# Patient Record
Sex: Female | Born: 1990
Health system: Southern US, Community
[De-identification: ages and names within clinical notes are randomized; demographics above are authoritative.]

## PROBLEM LIST (undated history)

## (undated) ENCOUNTER — Inpatient Hospital Stay (HOSPITAL_COMMUNITY): Payer: Self-pay

## (undated) DIAGNOSIS — F329 Major depressive disorder, single episode, unspecified: Secondary | ICD-10-CM

## (undated) DIAGNOSIS — F32A Depression, unspecified: Secondary | ICD-10-CM

## (undated) DIAGNOSIS — F419 Anxiety disorder, unspecified: Secondary | ICD-10-CM

## (undated) DIAGNOSIS — Z789 Other specified health status: Secondary | ICD-10-CM

## (undated) HISTORY — PX: TONSILLECTOMY: SUR1361

## (undated) HISTORY — PX: WISDOM TOOTH EXTRACTION: SHX21

---
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
## 2011-01-01 ENCOUNTER — Emergency Department (HOSPITAL_COMMUNITY)
Admission: EM | Admit: 2011-01-01 | Discharge: 2011-01-01 | Discharge status: Against Medical Advice | Payer: Medicaid Other | Attending: Emergency Medicine | Admitting: Emergency Medicine

## 2011-01-01 DIAGNOSIS — W868XXA Exposure to other electric current, initial encounter: Secondary | ICD-10-CM | POA: Insufficient documentation

## 2011-01-01 DIAGNOSIS — T754XXA Electrocution, initial encounter: Secondary | ICD-10-CM | POA: Insufficient documentation

## 2011-01-02 LAB — CBC
HCT: 36.3 % (ref 36.0–46.0)
Hemoglobin: 11.6 g/dL — ABNORMAL LOW (ref 12.0–15.0)
Platelets: 214 10*3/uL (ref 150–400)
RBC: 3.76 MIL/uL — ABNORMAL LOW (ref 3.87–5.11)

## 2011-01-02 LAB — GC/CHLAMYDIA PROBE AMP, GENITAL
Chlamydia, DNA Probe: NEGATIVE
GC Probe Amp, Genital: NEGATIVE

## 2011-01-02 LAB — URINALYSIS, ROUTINE W REFLEX MICROSCOPIC
Glucose, UA: NEGATIVE mg/dL
pH: 5 (ref 5.0–8.0)

## 2011-01-02 LAB — ABO/RH: ABO/RH(D): O POS

## 2011-01-02 LAB — WET PREP, GENITAL: Trich, Wet Prep: NONE SEEN

## 2011-01-04 LAB — POCT PREGNANCY, URINE: Preg Test, Ur: NEGATIVE

## 2011-01-04 LAB — URINALYSIS, ROUTINE W REFLEX MICROSCOPIC
Bilirubin Urine: NEGATIVE
Glucose, UA: NEGATIVE mg/dL
Hgb urine dipstick: NEGATIVE
Ketones, ur: NEGATIVE mg/dL
Protein, ur: NEGATIVE mg/dL
Specific Gravity, Urine: >1.03 — ABNORMAL HIGH (ref 1.005–1.030)
pH: 6 (ref 5.0–8.0)

## 2011-01-05 LAB — WET PREP, GENITAL: WBC, Wet Prep HPF POC: NONE SEEN

## 2011-01-05 LAB — URINALYSIS, ROUTINE W REFLEX MICROSCOPIC
Bilirubin Urine: NEGATIVE
Glucose, UA: NEGATIVE mg/dL
Specific Gravity, Urine: 1.019 (ref 1.005–1.030)

## 2011-01-05 LAB — POCT PREGNANCY, URINE: Preg Test, Ur: NEGATIVE

## 2011-01-05 LAB — URINE MICROSCOPIC-ADD ON

## 2011-01-05 LAB — GC/CHLAMYDIA PROBE AMP, GENITAL: GC Probe Amp, Genital: NEGATIVE

## 2011-01-06 LAB — POCT URINALYSIS DIP (DEVICE)
Glucose, UA: NEGATIVE mg/dL
Hgb urine dipstick: NEGATIVE
Specific Gravity, Urine: >=1.03 (ref 1.005–1.030)
Urobilinogen, UA: 0.2 mg/dL (ref 0.0–1.0)

## 2011-01-06 LAB — POCT PREGNANCY, URINE: Preg Test, Ur: NEGATIVE

## 2011-02-03 ENCOUNTER — Inpatient Hospital Stay (HOSPITAL_COMMUNITY)
Admission: AD | Admit: 2011-02-03 | Discharge: 2011-02-03 | Disposition: A | Discharge status: Home / Self Care | Payer: Medicaid Other | Source: Ambulatory Visit | Attending: Obstetrics and Gynecology | Admitting: Obstetrics and Gynecology

## 2011-02-03 DIAGNOSIS — O4700 False labor before 37 completed weeks of gestation, unspecified trimester: Secondary | ICD-10-CM

## 2011-02-23 ENCOUNTER — Inpatient Hospital Stay (HOSPITAL_COMMUNITY)
Admission: AD | Admit: 2011-02-23 | Discharge: 2011-02-23 | Disposition: A | Discharge status: Home / Self Care | Payer: Medicaid Other | Source: Ambulatory Visit | Attending: Obstetrics and Gynecology | Admitting: Obstetrics and Gynecology

## 2011-02-23 DIAGNOSIS — O479 False labor, unspecified: Secondary | ICD-10-CM | POA: Insufficient documentation

## 2011-03-01 ENCOUNTER — Inpatient Hospital Stay (HOSPITAL_COMMUNITY)
Admission: AD | Admit: 2011-03-01 | Discharge: 2011-03-01 | Disposition: A | Discharge status: Home / Self Care | Payer: Medicaid Other | Source: Ambulatory Visit | Attending: Obstetrics and Gynecology | Admitting: Obstetrics and Gynecology

## 2011-03-01 DIAGNOSIS — O4690 Antepartum hemorrhage, unspecified, unspecified trimester: Secondary | ICD-10-CM | POA: Insufficient documentation

## 2011-03-01 DIAGNOSIS — O368190 Decreased fetal movements, unspecified trimester, not applicable or unspecified: Secondary | ICD-10-CM | POA: Insufficient documentation

## 2011-03-01 DIAGNOSIS — O479 False labor, unspecified: Secondary | ICD-10-CM | POA: Insufficient documentation

## 2011-03-03 ENCOUNTER — Inpatient Hospital Stay (HOSPITAL_COMMUNITY)
Admission: AD | Admit: 2011-03-03 | Discharge: 2011-03-05 | DRG: 775 | Disposition: A | Discharge status: Home / Self Care | Payer: Medicaid Other | Source: Ambulatory Visit | Attending: Obstetrics and Gynecology | Admitting: Obstetrics and Gynecology

## 2011-03-03 DIAGNOSIS — Z22330 Carrier of Group B streptococcus: Secondary | ICD-10-CM

## 2011-03-03 DIAGNOSIS — O99892 Other specified diseases and conditions complicating childbirth: Principal | ICD-10-CM | POA: Diagnosis present

## 2011-03-03 LAB — CBC
HCT: 37.6 % (ref 36.0–46.0)
Hemoglobin: 12.8 g/dL (ref 12.0–15.0)
MCV: 96.7 fL (ref 78.0–100.0)
RBC: 3.89 MIL/uL (ref 3.87–5.11)
RDW: 13.3 % (ref 11.5–15.5)
WBC: 12 10*3/uL — ABNORMAL HIGH (ref 4.0–10.5)

## 2011-03-04 ENCOUNTER — Other Ambulatory Visit: Payer: Self-pay | Admitting: Obstetrics and Gynecology

## 2011-03-04 LAB — CBC
HCT: 34.3 % — ABNORMAL LOW (ref 36.0–46.0)
Hemoglobin: 11.4 g/dL — ABNORMAL LOW (ref 12.0–15.0)
MCH: 32 pg (ref 26.0–34.0)
MCV: 96.3 fL (ref 78.0–100.0)
Platelets: 157 10*3/uL (ref 150–400)
RBC: 3.56 MIL/uL — ABNORMAL LOW (ref 3.87–5.11)
WBC: 15.6 10*3/uL — ABNORMAL HIGH (ref 4.0–10.5)

## 2011-03-06 NOTE — Discharge Summary (Signed)
NAMEREBECA, VALDIVIA            ACCOUNT NO.:  [id number]  MEDICAL RECORD NO.:  [id number]           PATIENT TYPE:  I  LOCATION:  9125                          FACILITY:  [facility]  PHYSICIAN:  Malachi Pro. Ambrose Mantle, M.D. DATE OF BIRTH:  [birth year]  DATE OF ADMISSION:  03/03/2011 DATE OF DISCHARGE:  03/05/2011                              DISCHARGE SUMMARY   An 20 year old black female para 0, gravida 1, EDC Mar 07, 2011, by ultrasound, admitted in early labor.  Blood group and type O+, negative antibody, rubella immune, RPR nonreactive.  Urine culture negative, hepatitis B surface antigen negative, HIV negative, GC and Chlamydia negative, hemoglobin AA, cystic fibrosis declined.  First trimester screen negative, AFP negative, 1-hour Glucola 87, Group B strep positive.  Vaginal ultrasound on August 07, 2010, estimated gestational age [redacted] weeks 5 days, [facility] Mar 07, 2011.  Repeat ultrasound on October 03, 2010, estimated gestational age [redacted] weeks 4 days, [facility] Mar 09, 2011. Prenatal care was uncomplicated.  Nonstress test on Feb 28, 2011, for decreased fetal movement was normal.  The patient began contracting on Mar 02, 2011 and the contractions became regular on the day of admission.  Cervix had been closed on Feb 28, 2011 and on day of admission, was a fingertip 60%, vertex at -2.  PAST MEDICAL HISTORY:  No known allergies.  OPERATIONS:  T&A.  ILLNESSES:  None.  FAMILY HISTORY:  Maternal grandmother with diabetes mellitus and high blood pressure.  SOCIAL HISTORY:  Alcohol, tobacco and drugs none.  PHYSICAL EXAM ON ADMISSION:  VITAL SIGNS:  Normal. HEART/LUNGS:  Normal. ABDOMEN:  Soft.  Fundal height 35 cm.  Fetal heart tones normal.  Cervix fingertip 60%, vertex at -2.  Artificial rupture of membranes produced clear fluid.  The patient was begun on penicillin for her positive group B strep. After admission, an artificial rupture of membranes.  She did not develop a consistent labor  pattern, so Pitocin was begun.  She received an epidural and progressed a full dilatation.  After she began pushing, there was some bradycardia that sometimes persisted in spite of O2 and position change.  A left mediolateral episiotomy was done under local block and with 1 contraction, a living female infant 6 pounds 0 ounces, Apgars of 8 and 9 at 1 and 5 minutes was delivered LOA by Dr. Ambrose Mantle. The cord was short and small in caliber.  I did a cord blood collection, but it was probably inadequate.  Uterus was normal after the placenta delivered intact.  Left mediolateral episiotomy repaired with 3-0 Vicryl.  Blood loss about 400 mL.  Postpartum, the patient did quite well and was discharged on the second postpartum day.  Initial hemoglobin 12.8, hematocrit 37.6, white count 12,000, platelet count 183,000.  Followup hemoglobin 11.4, hematocrit 34.3, RPR was nonreactive.  FINAL DIAGNOSES:  Intrauterine pregnancy at 39 weeks and 3 days, delivered left occiput anterior.  OPERATION:  Spontaneous delivery, LOA, left mediolateral episiotomy and repair.  FINAL CONDITION:  Improved.  INSTRUCTIONS:  Our regular discharge instruction booklet.  Percocet 5/325, 30 tablets one every 4-6 hours needed for pain and Motrin 600 mg, 30  tablets one every 6 hours as needed for pain, given at discharge. She is advised to return in 6 weeks for followup examination.     Malachi Pro. Ambrose Mantle, M.D.     TFH/MEDQ  D:  03/05/2011  T:  03/05/2011  Job:  [id number]  Electronically Signed by Tracey Harries M.D. on 03/06/2011 11:32:11 AM

## 2011-03-07 NOTE — Op Note (Signed)
[facility]  Patient:    Tiffany Woodard, Tiffany Woodard                     MRN: [id number] Proc. Date: 04/26/01 Attending:  Carolan Shiver, M.D.                           Operative Report  PREOPERATIVE DIAGNOSIS:  Adenoid tonsillar hypertrophy with upper airway obstruction.  POSTOPERATIVE DIAGNOSIS:  Adenoid tonsillar hypertrophy with upper airway obstruction.  OPERATION PERFORMED:  Tonsillectomy and adenoidectomy.  SURGEON:  Carolan Shiver, M.D.  ANESTHESIA:  General endotracheal.  ANESTHESIOLOGIST:  Cliffton Asters. Ivin Booty, M.D.  INDICATIONS FOR PROCEDURE:  The patient is a 20-year-old black female whom I have been following for chronic ear disease beginning at age 79 months.  [place] was first seen by me on October 10, 1993.  She had a history of chronic ear disease, underwent BMTs on October 28, 1993 successfully.  She has been followed through the years.  Recently, she developed chronic upper airway obstruction with mouth breathing and snoring.  She was just short of frank obstructive sleep apnea per her mothers reports.  She had not had recurrent streptococcal tonsillitis.  PHYSICAL EXAMINATION:  On physical examination on April 01, 2001, [facility] was found to have 3-3/4+ tonsils with 0.5 cm oropharyngeal airway and near complete obstruction of her nasopharynx secondary to adenoid hyperplasia.  She was recommended for tonsillectomy and adenoidectomy.  Risks and complications of the procedures were explained to her mother.  Questions were invited and answered and informed consent was signed.  JUSTIFICATION FOR OUTPATIENT SETTING:  Patients age and need for general endotracheal anesthesia.  JUSTIFICATION FOR OVERNIGHT STAY:  23 hours of observation to rule out postoperative tonsillectomy hemorrhage, IV hydration and pain control.  DESCRIPTION OF PROCEDURE:  After the patient was taken to the operating room, she was placed in the supine position and was masked to  sleep by general anesthesia without difficulty under the guidance of Dr. Sheldon Silvan.  An IV was begun and she was orally intubated.  Eyelids were taped shut and she was properly positioned and monitored.  Elbows and ankles were padded with foam rubber.  Preoperative hemoglobin was 12.2, hematocrit was 36.2, white blood cell count was 7,800.  Platelet count 259,000.  PT 13.1, PTT 32, INR 1.0.  The patient was then turned 90 degrees and placed in the Rose position.  The head drape was applied and the Crowe-Davis mouth gag was inserted followed by a moistened throat pack.  Examination of her oropharynx revealed 4+ kissing tonsils.  The right tonsil was secured with a curved Allis clamp and an anterior pillar incision was made with cutting cautery.  The tonsillar capsule was identified and the tonsil was dissected from the tonsillar fossa with cutting and coagulating currents.  Vessels were cauterized in order.  The left tonsil was removed in the identical fashion.  Each fossa was then infiltrated with 1.5 cc of 0.5% Marcaine with 1:200,000 epinephrine.  A red rubber catheter was placed through the right naris and used as a soft palate retractor.  Examination of her nasopharynx in the mirror revealed 100% posterior choanal obstruction secondary to adenoid hyperplasia.  The adenoids were then removed with curved adenoid curets and bleeding controlled with packing and suction cautery.  The throat pack was removed and a #10 gauge [place] sump NG tube was inserted into the stomach and gastric contents were  evacuated.  The patient was then awakened, extubated and transferred to her hospital bed.  She appeared to tolerate both the general anesthesia and procedures well.  She left the operating room in stable condition.  TOTAL FLUIDS:  300 cc.  ESTIMATED BLOOD LOSS:  Less than 10 cc.  Sponge, needle and cotton ball counts were correct at termination of the procedure.  Tonsils and adenoid  specimens were sent to pathology for documentation.  The patient received Ancef 500 mg IV, Zofran 2 mg IV at the beginning and end of the procedure and Decadron 6 mg IV.  The patient will be admitted to the 23-hour recovery care stay unit for overnight observation, hydration and pain control.  If stable overnight, she will be discharged on April 27, 2001 with her mother who will be instructed to return her to my office on July 22 at 2:50 p.m.  DISCHARGE MEDICATIONS:  Augmentin suspension 600 mg p.o. b.i.d. x 10 days with food, Tylenol with codeine with elixir 1 to 1-1/2 teaspoonfuls p.o. q.4h. p.r.n. pain, Phenergan suppositories, 12.5 mg #2 1 p.r. q.6h. p.r.n. nausea. Her mother will be instructed to have her follow a soft diet x 1 week, keep her head elevated and avoid aspirin or aspirin products. They are to call [phone] for any postoperative problems and she will be given verbal and written instructions. DD:  04/26/01 TD:  04/26/01 Job: [id number]

## 2011-03-10 ENCOUNTER — Inpatient Hospital Stay (HOSPITAL_COMMUNITY)
Admission: AD | Admit: 2011-03-10 | Discharge: 2011-03-10 | Disposition: A | Discharge status: Home / Self Care | Payer: Medicaid Other | Source: Ambulatory Visit | Attending: Obstetrics and Gynecology | Admitting: Obstetrics and Gynecology

## 2011-03-10 DIAGNOSIS — O135 Gestational [pregnancy-induced] hypertension without significant proteinuria, complicating the puerperium: Secondary | ICD-10-CM | POA: Insufficient documentation

## 2011-03-10 LAB — COMPREHENSIVE METABOLIC PANEL
ALT: 20 U/L (ref 0–35)
AST: 21 U/L (ref 0–37)
CO2: 22 mEq/L (ref 19–32)
Chloride: 102 mEq/L (ref 96–112)
Creatinine, Ser: 0.78 mg/dL (ref 0.4–1.2)
GFR calc Af Amer: >60 mL/min (ref >60)
GFR calc non Af Amer: >60 mL/min (ref >60)
Glucose, Bld: 82 mg/dL (ref 70–99)
Total Bilirubin: 0.2 mg/dL — ABNORMAL LOW (ref 0.3–1.2)

## 2011-03-10 LAB — CBC
HCT: 38 % (ref 36.0–46.0)
Hemoglobin: 12.8 g/dL (ref 12.0–15.0)
MCH: 32.5 pg (ref 26.0–34.0)
MCHC: 33.7 g/dL (ref 30.0–36.0)
RBC: 3.94 MIL/uL (ref 3.87–5.11)

## 2011-03-10 LAB — URINALYSIS, ROUTINE W REFLEX MICROSCOPIC
Bilirubin Urine: NEGATIVE
Glucose, UA: NEGATIVE mg/dL
Hgb urine dipstick: NEGATIVE
Ketones, ur: NEGATIVE mg/dL
Nitrite: NEGATIVE
Specific Gravity, Urine: <1.005 — ABNORMAL LOW (ref 1.005–1.030)
pH: 6.5 (ref 5.0–8.0)

## 2011-03-10 LAB — LACTATE DEHYDROGENASE: LDH: 248 U/L (ref 94–250)

## 2011-03-11 ENCOUNTER — Emergency Department (HOSPITAL_COMMUNITY)
Admission: EM | Admit: 2011-03-11 | Discharge: 2011-03-11 | Discharge status: Against Medical Advice | Payer: Medicaid Other | Attending: Emergency Medicine | Admitting: Emergency Medicine

## 2011-03-11 DIAGNOSIS — O99893 Other specified diseases and conditions complicating puerperium: Secondary | ICD-10-CM | POA: Insufficient documentation

## 2011-03-11 DIAGNOSIS — R030 Elevated blood-pressure reading, without diagnosis of hypertension: Secondary | ICD-10-CM | POA: Insufficient documentation

## 2012-01-11 ENCOUNTER — Inpatient Hospital Stay (HOSPITAL_COMMUNITY): Payer: Medicaid Other

## 2012-01-11 ENCOUNTER — Inpatient Hospital Stay (HOSPITAL_COMMUNITY)
Admission: AD | Admit: 2012-01-11 | Discharge: 2012-01-11 | Disposition: A | Discharge status: Home / Self Care | Payer: Medicaid Other | Source: Ambulatory Visit | Attending: Obstetrics and Gynecology | Admitting: Obstetrics and Gynecology

## 2012-01-11 ENCOUNTER — Encounter (HOSPITAL_COMMUNITY): Payer: Self-pay | Admitting: *Deleted

## 2012-01-11 DIAGNOSIS — N949 Unspecified condition associated with female genital organs and menstrual cycle: Secondary | ICD-10-CM | POA: Insufficient documentation

## 2012-01-11 DIAGNOSIS — N926 Irregular menstruation, unspecified: Secondary | ICD-10-CM | POA: Insufficient documentation

## 2012-01-11 DIAGNOSIS — R109 Unspecified abdominal pain: Secondary | ICD-10-CM | POA: Insufficient documentation

## 2012-01-11 HISTORY — DX: Other specified health status: Z78.9

## 2012-01-11 LAB — URINALYSIS, ROUTINE W REFLEX MICROSCOPIC
Glucose, UA: NEGATIVE mg/dL
Hgb urine dipstick: NEGATIVE
Ketones, ur: NEGATIVE mg/dL
Protein, ur: NEGATIVE mg/dL
Urobilinogen, UA: 1 mg/dL (ref 0.0–1.0)

## 2012-01-11 LAB — CBC
HCT: 38 % (ref 36.0–46.0)
Hemoglobin: 12.8 g/dL (ref 12.0–15.0)
MCH: 31.1 pg (ref 26.0–34.0)
MCHC: 33.7 g/dL (ref 30.0–36.0)
MCV: 92.2 fL (ref 78.0–100.0)

## 2012-01-11 LAB — WET PREP, GENITAL: Clue Cells Wet Prep HPF POC: NONE SEEN

## 2012-01-11 NOTE — Discharge Instructions (Signed)
Abdominal Pain (Nonspecific)  Your exam might not show the exact reason you have abdominal pain. Since there are many different causes of abdominal pain, another checkup and more tests may be needed. It is very important to follow up for lasting (persistent) or worsening symptoms. A possible cause of abdominal pain in any person who still has his or her appendix is acute appendicitis. Appendicitis is often hard to diagnose. Normal blood tests, urine tests, ultrasound, and CT scans do not completely rule out early appendicitis or other causes of abdominal pain. Sometimes, only the changes that happen over time will allow appendicitis and other causes of abdominal pain to be determined. Other potential problems that may require surgery may also take time to become more apparent. Because of this, it is important that you follow all of the instructions below.  HOME CARE INSTRUCTIONS    Rest as much as possible.   Do not eat solid food until your pain is gone.   While adults or children have pain: A diet of water, weak decaffeinated tea, broth or bouillon, gelatin, oral rehydration solutions (ORS), frozen ice pops, or ice chips may be helpful.   When pain is gone in adults or children: Start a light diet (dry toast, crackers, applesauce, or white rice). Increase the diet slowly as long as it does not bother you. Eat no dairy products (including cheese and eggs) and no spicy, fatty, fried, or high-fiber foods.   Use no alcohol, caffeine, or cigarettes.   Take your regular medicines unless your caregiver told you not to.   Take any prescribed medicine as directed.   Only take over-the-counter or prescription medicines for pain, discomfort, or fever as directed by your caregiver. Do not give aspirin to children.  If your caregiver has given you a follow-up appointment, it is very important to keep that appointment. Not keeping the appointment could result in a permanent injury and/or lasting (chronic) pain and/or  disability. If there is any problem keeping the appointment, you must call to reschedule.   SEEK IMMEDIATE MEDICAL CARE IF:    Your pain is not gone in 24 hours.   Your pain becomes worse, changes location, or feels different.   You or your child has an oral temperature above 102 F (38.9 C), not controlled by medicine.   Your baby is older than 3 months with a rectal temperature of 102 F (38.9 C) or higher.   Your baby is 3 months old or younger with a rectal temperature of 100.4 F (38 C) or higher.   You have shaking chills.   You keep throwing up (vomiting) or cannot drink liquids.   There is blood in your vomit or you see blood in your bowel movements.   Your bowel movements become dark or black.   You have frequent bowel movements.   Your bowel movements stop (become blocked) or you cannot pass gas.   You have bloody, frequent, or painful urination.   You have yellow discoloration in the skin or whites of the eyes.   Your stomach becomes bloated or bigger.   You have dizziness or fainting.   You have chest or back pain.  MAKE SURE YOU:    Understand these instructions.   Will watch your condition.   Will get help right away if you are not doing well or get worse.  Document Released: 10/06/2005 Document Revised: 09/25/2011 Document Reviewed: 09/03/2009  ExitCare Patient Information 2012 [employer].

## 2012-01-11 NOTE — Progress Notes (Signed)
Wet prep and cultures collected.  VE done.

## 2012-01-11 NOTE — MAU Note (Signed)
Stopped her birth control in February, having abdominal pain for about 1 month, no vaginal discharge, no dysuria, regular bowel movements, no N/V

## 2012-01-11 NOTE — MAU Note (Signed)
L. Leftwich kirby, cnm at bedside.  Assessment done and poc discussed with pt.

## 2012-01-11 NOTE — MAU Provider Note (Signed)
History     CSN: [id number]  Arrival date and time: 01/11/12 1807   First Provider Initiated Contact with Patient 01/11/12 1911     21 y.o.G1P1001 Chief Complaint  Patient presents with  . Abdominal Pain   HPI Pt presents with irregular periods and abdominal pain x2-3 days described as constant cramping.  She also reports breast tenderness.  She denies vaginal bleeding, vaginal itching/burning, urinary symptoms, n/v, fever/chills, or dizziness.  Her last menstrual period was in the beginning of February.  Her last BM was today.   OB History    Grav Para Term Preterm Abortions TAB SAB Ect Mult Living   1 1 1       1       Past Medical History  Diagnosis Date  . No pertinent past medical history     Past Surgical History  Procedure Date  . No past surgeries     No family history on file.  History  Substance Use Topics  . Smoking status: Never Smoker   . Smokeless tobacco: Not on file  . Alcohol Use: No    Allergies: No Known Allergies  No prescriptions prior to admission    Review of Systems  Constitutional: Negative for fever, chills and malaise/fatigue.  Eyes: Negative for blurred vision.  Respiratory: Negative for cough and shortness of breath.   Cardiovascular: Negative for chest pain.  Gastrointestinal: Positive for abdominal pain. Negative for heartburn, nausea and vomiting.  Genitourinary: Negative for dysuria, urgency and frequency.  Musculoskeletal: Negative.   Neurological: Negative for dizziness and headaches.  Psychiatric/Behavioral: Negative for depression.   Physical Exam   Blood pressure 118/73, pulse 76, temperature 98.6 F (37 C), temperature source Oral, resp. rate 16, height 5\' 2"  (1.575 m), weight 51.71 kg (114 lb).  Physical Exam  Nursing note and vitals reviewed. Constitutional: She is oriented to person, place, and time. She appears well-developed and well-nourished.  Neck: Normal range of motion.  Cardiovascular: Normal rate,  regular rhythm and normal heart sounds.   Respiratory: Effort normal and breath sounds normal.  GI: Soft. Bowel sounds are normal.  Genitourinary:       Pelvic exam: Cervix pink with darker ectropion area at cervical os, moderate amount white sticky discharge, vagina and external genitalia normal  Bimanual exam: Cervix 0/long/high, neg CMT, uterus nonpregnant size, nontender, anteflexed, adnexa without tenderness, enlargement, or mass  Musculoskeletal: Normal range of motion.  Neurological: She is alert and oriented to person, place, and time.  Skin: Skin is warm and dry.  Psychiatric: She has a normal mood and affect. Her behavior is normal. Judgment and thought content normal.   Neg CVA tenderness Neg rebound tenderness, neg guarding, neg McBurney's pt tenderness  MAU Course  Procedures U/A, Pelvic exam with wet prep and GC/Chlamydia, CBC, U/S  US Transvaginal Non-ob  01/11/2012  *RADIOLOGY REPORT*  Clinical Data: 21 year old female with pelvic pain and irregular menses.  TRANSABDOMINAL AND TRANSVAGINAL ULTRASOUND OF PELVIS Technique:  Both transabdominal and transvaginal ultrasound examinations of the pelvis were performed. Transabdominal technique was performed for global imaging of the pelvis including uterus, ovaries, adnexal regions, and pelvic cul-de-sac.  Comparison: None   It was necessary to proceed with endovaginal exam following the transabdominal exam to visualize the ovaries and endometrium.  Findings:  Uterus: The uterus is unremarkable measuring 9.7 x 4.1 x 6.2 cm. No uterine masses are identified.  Endometrium: The endometrial stripe is homogeneous measuring 6 mm in greatest diameter.  Right ovary:  The  right ovary is unremarkable measuring 3.4 x 1.7 x 2.2 cm.  Flow within the right ovary is noted.  Left ovary: The left ovary is unremarkable measuring 3.1 x 1.9 x 3.2 cm.  Flow within the left ovary is noted.  Other findings: There is no evidence of free fluid or adnexal mass.   IMPRESSION: Normal study. No evidence of pelvic mass or other significant abnormality.  Original Report Authenticated By: Rosendo Gros, M.D.   US Pelvis Complete  01/11/2012  *RADIOLOGY REPORT*  Clinical Data: 21 year old female with pelvic pain and irregular menses.  TRANSABDOMINAL AND TRANSVAGINAL ULTRASOUND OF PELVIS Technique:  Both transabdominal and transvaginal ultrasound examinations of the pelvis were performed. Transabdominal technique was performed for global imaging of the pelvis including uterus, ovaries, adnexal regions, and pelvic cul-de-sac.  Comparison: None   It was necessary to proceed with endovaginal exam following the transabdominal exam to visualize the ovaries and endometrium.  Findings:  Uterus: The uterus is unremarkable measuring 9.7 x 4.1 x 6.2 cm. No uterine masses are identified.  Endometrium: The endometrial stripe is homogeneous measuring 6 mm in greatest diameter.  Right ovary:  The right ovary is unremarkable measuring 3.4 x 1.7 x 2.2 cm.  Flow within the right ovary is noted.  Left ovary: The left ovary is unremarkable measuring 3.1 x 1.9 x 3.2 cm.  Flow within the left ovary is noted.  Other findings: There is no evidence of free fluid or adnexal mass.  IMPRESSION: Normal study. No evidence of pelvic mass or other significant abnormality.  Original Report Authenticated By: Rosendo Gros, M.D.   MDM Called Dr Senaida Ores.  Plan to D/C with f/u for contraception/management of irregular menses if normal U/S.    Assessment and Plan  A: Pelvic pain Irregular menses  P: D/C home F/U with Dr Senaida Ores for contraception and management of irregular menses Return to MAU as needed  LEFTWICH-KIRBY, Nesa Distel 01/11/2012, 7:28 PM

## 2012-01-11 NOTE — MAU Provider Note (Signed)
  History     CSN: [id number]  Arrival date and time: 01/11/12 1807   First Provider Initiated Contact with Patient 01/11/12 1911      Chief Complaint  Patient presents with  . Abdominal Pain   HPI Received patient from L Leftwich-Kirby CNM.  Awaiting [place] result prior to discharge.  OB History    Grav Para Term Preterm Abortions TAB SAB Ect Mult Living   1 1 1       1       Past Medical History  Diagnosis Date  . No pertinent past medical history     Past Surgical History  Procedure Date  . No past surgeries     No family history on file.  History  Substance Use Topics  . Smoking status: Never Smoker   . Smokeless tobacco: Not on file  . Alcohol Use: No    Allergies: No Known Allergies  Prescriptions prior to admission  Medication Sig Dispense Refill  . ibuprofen (ADVIL,MOTRIN) 200 MG tablet Take 200 mg by mouth every 6 (six) hours as needed. cramps        ROS As prior note Physical Exam   Blood pressure 118/73, pulse 76, temperature 98.6 F (37 C), temperature source Oral, resp. rate 16, height 5\' 2"  (1.575 m), weight 114 lb (51.71 kg).  Physical Exam See prior note  US Pelvis Complete  01/11/2012  *RADIOLOGY REPORT*  Clinical Data: 21 year old female with pelvic pain and irregular menses.  TRANSABDOMINAL AND TRANSVAGINAL ULTRASOUND OF PELVIS Technique:  Both transabdominal and transvaginal ultrasound examinations of the pelvis were performed. Transabdominal technique was performed for global imaging of the pelvis including uterus, ovaries, adnexal regions, and pelvic cul-de-sac.  Comparison: None   It was necessary to proceed with endovaginal exam following the transabdominal exam to visualize the ovaries and endometrium.  Findings:  Uterus: The uterus is unremarkable measuring 9.7 x 4.1 x 6.2 cm. No uterine masses are identified.  Endometrium: The endometrial stripe is homogeneous measuring 6 mm in greatest diameter.  Right ovary:  The right ovary is  unremarkable measuring 3.4 x 1.7 x 2.2 cm.  Flow within the right ovary is noted.  Left ovary: The left ovary is unremarkable measuring 3.1 x 1.9 x 3.2 cm.  Flow within the left ovary is noted.  Other findings: There is no evidence of free fluid or adnexal mass.  IMPRESSION: Normal study. No evidence of pelvic mass or other significant abnormality.  Original Report Authenticated By: Rosendo Gros, M.D.   MAU Course  Procedures  MDM Per Dr Senaida Ores, pt OK to go home.   Assessment and Plan  A:  Abdominal discomfort, probably GI      No evidence of pathology P:  D/C home      Pt promises to make appt with Dr Senaida Ores soon to discuss contraception  [facility] 01/11/2012, 8:42 PM

## 2012-02-05 ENCOUNTER — Encounter (HOSPITAL_COMMUNITY): Payer: Self-pay | Admitting: *Deleted

## 2012-02-05 ENCOUNTER — Inpatient Hospital Stay (HOSPITAL_COMMUNITY)
Admission: AD | Admit: 2012-02-05 | Discharge: 2012-02-05 | Disposition: A | Discharge status: Home / Self Care | Payer: Medicaid Other | Source: Ambulatory Visit | Attending: Obstetrics and Gynecology | Admitting: Obstetrics and Gynecology

## 2012-02-05 DIAGNOSIS — N644 Mastodynia: Secondary | ICD-10-CM | POA: Insufficient documentation

## 2012-02-05 DIAGNOSIS — Z3202 Encounter for pregnancy test, result negative: Secondary | ICD-10-CM

## 2012-02-05 DIAGNOSIS — R109 Unspecified abdominal pain: Secondary | ICD-10-CM | POA: Insufficient documentation

## 2012-02-05 LAB — URINALYSIS, ROUTINE W REFLEX MICROSCOPIC
Bilirubin Urine: NEGATIVE
Leukocytes, UA: NEGATIVE
Nitrite: NEGATIVE
Specific Gravity, Urine: 1.015 (ref 1.005–1.030)
Urobilinogen, UA: 1 mg/dL (ref 0.0–1.0)

## 2012-02-05 LAB — POCT PREGNANCY, URINE: Preg Test, Ur: NEGATIVE

## 2012-02-05 NOTE — MAU Provider Note (Signed)
  History     CSN: [id number]  Arrival date and time: 02/05/12 2119   None     Chief Complaint  Patient presents with  . Abdominal Pain   HPI 21 y.o. G1P1001 with low abd pain x 3 days, sore breasts x 1 day, sexually activity, irregularly uses condoms for birth control. Denies discharge or bleeding.     Past Medical History  Diagnosis Date  . No pertinent past medical history     Past Surgical History  Procedure Date  . Wisdom tooth extraction     History reviewed. No pertinent family history.  History  Substance Use Topics  . Smoking status: Never Smoker   . Smokeless tobacco: Not on file  . Alcohol Use: No    Allergies: No Known Allergies  Prescriptions prior to admission  Medication Sig Dispense Refill  . ibuprofen (ADVIL,MOTRIN) 200 MG tablet Take 200 mg by mouth every 6 (six) hours as needed. cramps        Review of Systems  Constitutional: Negative.   Respiratory: Negative.   Cardiovascular: Negative.   Gastrointestinal: Positive for abdominal pain. Negative for nausea, vomiting, diarrhea and constipation.  Genitourinary: Negative for dysuria, urgency, frequency, hematuria and flank pain.       Negative for vaginal bleeding, vaginal discharge, dyspareunia  Musculoskeletal: Negative.   Neurological: Negative.   Psychiatric/Behavioral: Negative.    Physical Exam   Blood pressure 118/75, pulse 67, temperature 98 F (36.7 C), temperature source Oral, resp. rate 18, height 5\' 2"  (1.575 m), weight 115 lb (52.164 kg), last menstrual period 01/22/2012, SpO2 100.00%.  Physical Exam  Nursing note and vitals reviewed. Constitutional: She is oriented to person, place, and time. She appears well-developed and well-nourished. No distress.  Cardiovascular: Normal rate.   Respiratory: Effort normal.  Musculoskeletal: Normal range of motion.  Neurological: She is alert and oriented to person, place, and time.  Skin: Skin is warm and dry.  Psychiatric: She has a  normal mood and affect.   Pt declines further exam - states she just wanted to make sure she was not pregnant MAU Course  Procedures  Results for orders placed during the hospital encounter of 02/05/12 (from the past 24 hour(s))  POCT PREGNANCY, URINE     Status: Normal   Collection Time   02/05/12  9:42 PM      Component Value Range   Preg Test, Ur NEGATIVE  NEGATIVE      Assessment and Plan  21 y.o. G1P1001 with breast tenderness and mild low abd pain - negative UPT, likely hormonal/ovulatory Encouraged patient to follow up for contraceptive management - states she does not want to be pregnant right now  [facility] [place] 02/05/2012, 9:51 PM

## 2012-02-05 NOTE — MAU Note (Signed)
Pt reports stomach pain x 3 days, breast tenderness today.LMP 01/22/2012. Denies nausea, vomiting, diarrhea, fever, dysuria.

## 2012-06-18 ENCOUNTER — Inpatient Hospital Stay (HOSPITAL_COMMUNITY)
Admission: AD | Admit: 2012-06-18 | Discharge: 2012-06-18 | Disposition: A | Discharge status: Home / Self Care | Payer: Self-pay | Source: Ambulatory Visit | Attending: Obstetrics and Gynecology | Admitting: Obstetrics and Gynecology

## 2012-06-18 ENCOUNTER — Encounter (HOSPITAL_COMMUNITY): Payer: Self-pay

## 2012-06-18 DIAGNOSIS — R42 Dizziness and giddiness: Secondary | ICD-10-CM | POA: Insufficient documentation

## 2012-06-18 DIAGNOSIS — Z3202 Encounter for pregnancy test, result negative: Secondary | ICD-10-CM | POA: Insufficient documentation

## 2012-06-18 DIAGNOSIS — N912 Amenorrhea, unspecified: Secondary | ICD-10-CM | POA: Insufficient documentation

## 2012-06-18 DIAGNOSIS — R109 Unspecified abdominal pain: Secondary | ICD-10-CM | POA: Insufficient documentation

## 2012-06-18 LAB — URINALYSIS, ROUTINE W REFLEX MICROSCOPIC
Bilirubin Urine: NEGATIVE
Hgb urine dipstick: NEGATIVE
Protein, ur: NEGATIVE mg/dL
Urobilinogen, UA: 0.2 mg/dL (ref 0.0–1.0)

## 2012-06-18 NOTE — MAU Provider Note (Signed)
History     CSN: [id number]  Arrival date & time 06/18/12  1556   None     Chief Complaint  Patient presents with  . Possible Pregnancy  . Nausea  . Dizziness  . Abdominal Pain    HPI Tiffany Woodard is a 21 y.o. female who presents to MAU with pregnancy symptoms. States she has had breast tenderness, nausea and missed period. LMP 05/08/12. Did not take a home pregnancy test. Was here in April for same and was not pregnant then. Denies pain in abdomen only occasional cramping. Breast are tender. Not using any birth control. Last pap smear about a year ago and was normal. The history was provided by the patient.   Past Medical History  Diagnosis Date  . No pertinent past medical history     Past Surgical History  Procedure Date  . Wisdom tooth extraction     No family history on file.  History  Substance Use Topics  . Smoking status: Never Smoker   . Smokeless tobacco: Not on file  . Alcohol Use: No    OB History    Grav Para Term Preterm Abortions TAB SAB Ect Mult Living   1 1 1       1       Review of Systems: As stated in HPI  Allergies  Review of patient's allergies indicates no known allergies.  Home Medications  No current outpatient prescriptions on file.  BP 125/74  Pulse 67  Temp 99.1 F (37.3 C) (Oral)  Resp 16  Ht 5\' 2"  (1.575 m)  Wt 104 lb (47.174 kg)  BMI 19.02 kg/m2  SpO2 100%  LMP 05/08/2012  Physical Exam  Nursing note and vitals reviewed. Constitutional: She is oriented to person, place, and time. She appears well-developed and well-nourished. No distress.  HENT:  Head: Normocephalic.  Eyes: EOM are normal.  Neck: Neck supple.  Cardiovascular: Normal rate.   Pulmonary/Chest: Effort normal.  Abdominal: Soft. There is no tenderness.  Musculoskeletal: Normal range of motion.  Neurological: She is alert and oriented to person, place, and time.  Skin: Skin is warm and dry.  Psychiatric: She has a normal mood and affect. Her  behavior is normal. Judgment and thought content normal.   Results for orders placed during the hospital encounter of 06/18/12 (from the past 24 hour(s))  POCT PREGNANCY, URINE     Status: Normal   Collection Time   06/18/12  4:28 PM      Component Value Range   Preg Test, Ur NEGATIVE  NEGATIVE    Assessment: Amenorrhea  Plan:  Appointment for follow up in the office    Patient states it is time for her pap and yearly physical so she will have exam done then.  ED Course  Procedures Discussed with the patient and all questioned fully answered. Discussed in detail with the patient reasons for amenorrhea and need for follow up with her GYN.  She will follow up in the office or return here if any problems arise. Medication List  As of 06/18/2012  4:49 PM   CONTINUE taking these medications         ibuprofen 200 MG tablet   Commonly known as: ADVIL,MOTRIN           Follow-up Information    Follow up with BOVARD,JODY, MD.   Contact information:   [address] [place] [address] [phone]

## 2012-06-18 NOTE — MAU Note (Signed)
Patient states she has been nauseated, having dizzy spells and abdominal cramping for about 1 1/2 weeks. Is late for period. No bleeding, slight discharge.

## 2012-06-22 ENCOUNTER — Inpatient Hospital Stay (HOSPITAL_COMMUNITY)
Admission: AD | Admit: 2012-06-22 | Discharge: 2012-06-22 | Disposition: A | Discharge status: Home / Self Care | Payer: Medicaid Other | Source: Ambulatory Visit | Attending: Obstetrics and Gynecology | Admitting: Obstetrics and Gynecology

## 2012-06-22 ENCOUNTER — Inpatient Hospital Stay (HOSPITAL_COMMUNITY): Payer: Medicaid Other

## 2012-06-22 ENCOUNTER — Encounter (HOSPITAL_COMMUNITY): Payer: Self-pay | Admitting: *Deleted

## 2012-06-22 DIAGNOSIS — R109 Unspecified abdominal pain: Secondary | ICD-10-CM | POA: Insufficient documentation

## 2012-06-22 DIAGNOSIS — O26899 Other specified pregnancy related conditions, unspecified trimester: Secondary | ICD-10-CM

## 2012-06-22 DIAGNOSIS — O9989 Other specified diseases and conditions complicating pregnancy, childbirth and the puerperium: Secondary | ICD-10-CM

## 2012-06-22 DIAGNOSIS — O99891 Other specified diseases and conditions complicating pregnancy: Secondary | ICD-10-CM | POA: Insufficient documentation

## 2012-06-22 LAB — URINALYSIS, ROUTINE W REFLEX MICROSCOPIC
Glucose, UA: NEGATIVE mg/dL
Hgb urine dipstick: NEGATIVE
Specific Gravity, Urine: 1.025 (ref 1.005–1.030)
pH: 6 (ref 5.0–8.0)

## 2012-06-22 LAB — CBC
Hemoglobin: 12.5 g/dL (ref 12.0–15.0)
MCHC: 34.2 g/dL (ref 30.0–36.0)
RBC: 3.92 MIL/uL (ref 3.87–5.11)

## 2012-06-22 LAB — HCG, QUANTITATIVE, PREGNANCY: hCG, Beta Chain, Quant, S: 54 m[IU]/mL — ABNORMAL HIGH (ref <5)

## 2012-06-22 LAB — POCT PREGNANCY, URINE: Preg Test, Ur: POSITIVE — AB

## 2012-06-22 LAB — WET PREP, GENITAL
Trich, Wet Prep: NONE SEEN
Yeast Wet Prep HPF POC: NONE SEEN

## 2012-06-22 NOTE — MAU Note (Signed)
Pt G2 P1, unsure of LMP, +UPT at [facility] today, having lower abd cramping and increased pain on Left side.  Small amt of bleeding today.

## 2012-06-22 NOTE — MAU Provider Note (Signed)
History     CSN: [id number]  Arrival date and time: 06/22/12 1846   None     Chief Complaint  Patient presents with  . Abdominal Pain   HPI This is a 21 y.o. female at 3.[redacted] weeks gestation who presents with c/o cramping. Denies bleeding. No fever or other symptoms  OB History    Grav Para Term Preterm Abortions TAB SAB Ect Mult Living   2 1 1       1       Past Medical History  Diagnosis Date  . No pertinent past medical history     Past Surgical History  Procedure Date  . Wisdom tooth extraction     No family history on file.  History  Substance Use Topics  . Smoking status: Never Smoker   . Smokeless tobacco: Not on file  . Alcohol Use: No    Allergies: No Known Allergies  Prescriptions prior to admission  Medication Sig Dispense Refill  . Prenatal Vit-Fe Fumarate-FA (PRENATAL MULTIVITAMIN) TABS Take 1 tablet by mouth daily. Similac brand      . DISCONTD: ibuprofen (ADVIL,MOTRIN) 200 MG tablet Take 200 mg by mouth every 6 (six) hours as needed. cramps        ROS As in HPI  Physical Exam   Blood pressure 116/80, pulse 83, temperature 98 F (36.7 C), temperature source Oral, resp. rate 16, height 5\' 3"  (1.6 m), weight 104 lb 9.6 oz (47.446 kg), last menstrual period 05/08/2012.  Physical Exam  Constitutional: She is oriented to person, place, and time. She appears well-developed and well-nourished. No distress.  Cardiovascular: Normal rate.   Respiratory: Effort normal.  GI: Soft.  Genitourinary: Vagina normal and uterus normal. No vaginal discharge found.       Uterus small. Adnexae nontender   Musculoskeletal: Normal range of motion.  Neurological: She is alert and oriented to person, place, and time.  Skin: Skin is warm and dry.  Psychiatric: She has a normal mood and affect.   Results for orders placed during the hospital encounter of 06/22/12 (from the past 24 hour(s))  URINALYSIS, ROUTINE W REFLEX MICROSCOPIC     Status: Normal   Collection  Time   06/22/12  7:23 PM      Component Value Range   Color, Urine YELLOW  YELLOW   APPearance CLEAR  CLEAR   Specific Gravity, Urine 1.025  1.005 - 1.030   pH 6.0  5.0 - 8.0   Glucose, UA NEGATIVE  NEGATIVE mg/dL   Hgb urine dipstick NEGATIVE  NEGATIVE   Bilirubin Urine NEGATIVE  NEGATIVE   Ketones, ur NEGATIVE  NEGATIVE mg/dL   Protein, ur NEGATIVE  NEGATIVE mg/dL   Urobilinogen, UA 1.0  0.0 - 1.0 mg/dL   Nitrite NEGATIVE  NEGATIVE   Leukocytes, UA NEGATIVE  NEGATIVE  POCT PREGNANCY, URINE     Status: Abnormal   Collection Time   06/22/12  7:31 PM      Component Value Range   Preg Test, Ur POSITIVE (*) NEGATIVE  HCG, QUANTITATIVE, PREGNANCY     Status: Abnormal   Collection Time   06/22/12  8:11 PM      Component Value Range   hCG, Beta Chain, Quant, S 54 (*) <5 mIU/mL  CBC     Status: Abnormal   Collection Time   06/22/12  8:11 PM      Component Value Range   WBC 10.6 (*) 4.0 - 10.5 K/uL   RBC 3.92  3.87 - 5.11 MIL/uL   Hemoglobin 12.5  12.0 - 15.0 g/dL   HCT 16.1  09.6 - 04.5 %   MCV 93.1  78.0 - 100.0 fL   MCH 31.9  26.0 - 34.0 pg   MCHC 34.2  30.0 - 36.0 g/dL   RDW 40.9  81.1 - 91.4 %   Platelets 226  150 - 400 K/uL  .    US Ob Transvaginal  06/22/2012  *RADIOLOGY REPORT*  Clinical Data: Pelvic pain.  Unsure of last menstrual period. Quantitative beta HCG 54.  OBSTETRIC <14 WK ULTRASOUND  Technique:  Transabdominal ultrasound was performed for evaluation of the gestation as well as the maternal uterus and adnexal regions.  Comparison:  None.  Intrauterine gestational sac: Not visualized. Yolk sac: Not visualized Embryo: .  Not visualized. Cardiac Activity: Not visualized.  Maternal uterus/Adnexae: Unremarkable.  IMPRESSION: No intrauterine gestation is visualized.  This may be secondary to the early gestational age.  Clinical correlation and correlation with serial beta HCGs recommended at which point follow-up ultrasound can then be performed.   Original Report Authenticated  By: Fuller Canada, M.D.    MAU Course  Procedures   Assessment and Plan  A:  Pregnancy at 3.5 weeks      Cramping  P:  Discussed with Dr Senaida Ores      Repeat Quant in 2 days  [facility] 06/22/2012, 10:25 PM

## 2012-06-23 LAB — GC/CHLAMYDIA PROBE AMP, GENITAL: GC Probe Amp, Genital: NEGATIVE

## 2012-06-24 ENCOUNTER — Encounter (HOSPITAL_COMMUNITY): Payer: Self-pay

## 2012-06-24 ENCOUNTER — Inpatient Hospital Stay (HOSPITAL_COMMUNITY)
Admission: AD | Admit: 2012-06-24 | Discharge: 2012-06-24 | Disposition: A | Discharge status: Home / Self Care | Payer: Medicaid Other | Source: Ambulatory Visit | Attending: Obstetrics & Gynecology | Admitting: Obstetrics & Gynecology

## 2012-06-24 DIAGNOSIS — O99891 Other specified diseases and conditions complicating pregnancy: Secondary | ICD-10-CM | POA: Insufficient documentation

## 2012-06-24 DIAGNOSIS — O26899 Other specified pregnancy related conditions, unspecified trimester: Secondary | ICD-10-CM

## 2012-06-24 DIAGNOSIS — R109 Unspecified abdominal pain: Secondary | ICD-10-CM

## 2012-06-24 NOTE — MAU Note (Signed)
Patient to MAU for repeat BHCG. Patient denies any pain or bleeding.  

## 2012-06-24 NOTE — MAU Provider Note (Signed)
Reason for visit: F/U quant  Initial contact @ 1940  Subjective: 21 yo G2P1001 @ [redacted]w[redacted]d LMP Initially seen in MAU on 8/ 30/13 for suprapubic cramping and had a negative UPT. She returned on 06/22/2012 still having cramping and at that time had a positive  UPT and a quant of 54, neg WP, neg GC/CT.Marland Kitchen She still has some mild menstrual-like cramping but no severe pain and no vaginal bleeding.  Objective: Filed Vitals:   06/24/12 1812  BP: 118/69  Pulse: 71  Temp: 99.5 F (37.5 C)  Resp: 16   Gen: NAD Abd: soft, flat, NT  Lab: Results for orders placed during the hospital encounter of 06/24/12 (from the past 24 hour(s))  HCG, QUANTITATIVE, PREGNANCY     Status: Abnormal   Collection Time   06/24/12  6:20 PM      Component Value Range   hCG, Beta Chain, Quant, S 108 (*) <5 mIU/mL     Assessment sment: G2 P1001  Mild abdominal pain in very early pregnancy with normal rise in quantitative beta hCG   Plan: D/W Dr. Marice Potter Home with ectopic precautions and we will schedule viability [place] for 2 wks.

## 2012-06-25 ENCOUNTER — Other Ambulatory Visit (HOSPITAL_COMMUNITY): Payer: Self-pay | Admitting: Obstetrics and Gynecology

## 2012-06-25 DIAGNOSIS — R109 Unspecified abdominal pain: Secondary | ICD-10-CM

## 2012-07-07 ENCOUNTER — Ambulatory Visit (HOSPITAL_COMMUNITY)
Admission: RE | Admit: 2012-07-07 | Discharge: 2012-07-07 | Disposition: A | Discharge status: Home / Self Care | Payer: Medicaid Other | Source: Ambulatory Visit | Attending: Obstetrics and Gynecology | Admitting: Obstetrics and Gynecology

## 2012-07-07 ENCOUNTER — Inpatient Hospital Stay (HOSPITAL_COMMUNITY)
Admission: AD | Admit: 2012-07-07 | Discharge: 2012-07-07 | Disposition: A | Discharge status: Home / Self Care | Payer: Medicaid Other | Source: Ambulatory Visit | Attending: Obstetrics & Gynecology | Admitting: Obstetrics & Gynecology

## 2012-07-07 ENCOUNTER — Encounter (HOSPITAL_COMMUNITY): Payer: Self-pay

## 2012-07-07 DIAGNOSIS — R109 Unspecified abdominal pain: Secondary | ICD-10-CM

## 2012-07-07 DIAGNOSIS — Z1389 Encounter for screening for other disorder: Secondary | ICD-10-CM

## 2012-07-07 DIAGNOSIS — O99891 Other specified diseases and conditions complicating pregnancy: Secondary | ICD-10-CM | POA: Insufficient documentation

## 2012-07-07 DIAGNOSIS — Z3490 Encounter for supervision of normal pregnancy, unspecified, unspecified trimester: Secondary | ICD-10-CM

## 2012-07-07 DIAGNOSIS — Z3689 Encounter for other specified antenatal screening: Secondary | ICD-10-CM | POA: Insufficient documentation

## 2012-07-07 NOTE — MAU Note (Signed)
Patient to MAU after ultrasound to confirm viability. Patient denies pain or bleeding.  

## 2012-07-07 NOTE — MAU Provider Note (Signed)
  History     CSN: [id number]  Arrival date and time: 07/07/12 1048   None     Chief Complaint  Patient presents with  . Follow-up   HPI Tiffany Woodard 21 y.o. [redacted]w[redacted]d  Seen previously in MAU on 06-24-12 and is here today for follow up ultrasound.  No cramping.  No bleeding.  OB History    Grav Para Term Preterm Abortions TAB SAB Ect Mult Living   2 1 1       1       Past Medical History  Diagnosis Date  . No pertinent past medical history     Past Surgical History  Procedure Date  . Wisdom tooth extraction     No family history on file.  History  Substance Use Topics  . Smoking status: Never Smoker   . Smokeless tobacco: Not on file  . Alcohol Use: No    Allergies: No Known Allergies  Prescriptions prior to admission  Medication Sig Dispense Refill  . Prenatal Vit-Fe Fumarate-FA (PRENATAL MULTIVITAMIN) TABS Take 1 tablet by mouth daily. Similac brand        ROS Physical Exam   Blood pressure 100/66, pulse 68, temperature 98.2 F (36.8 C), temperature source Oral, resp. rate 16, last menstrual period 05/08/2012, SpO2 100.00%.  Physical Exam  Nursing note and vitals reviewed. Constitutional: She is oriented to person, place, and time. She appears well-developed and well-nourished.  HENT:  Head: Normocephalic.  Eyes: EOM are normal.  Neck: Neck supple.  Musculoskeletal: Normal range of motion.  Neurological: She is alert and oriented to person, place, and time.  Skin: Skin is warm and dry.  Psychiatric: She has a normal mood and affect.    MAU Course  Procedures  MDM Ultrasound report viewed on computer.  Unable to copy report for chart note.  Assessment and Plan  Viable IUP  Plan Begin prenatal care as soon as possible.  Has applied for Medicaid and will schedule when card comes.  Tiffany Woodard 07/07/2012, 11:32 AM

## 2012-07-07 NOTE — MAU Provider Note (Signed)
Attestation of Attending Supervision of Advanced Practitioner (CNM/NP): Evaluation and management procedures were performed by the Advanced Practitioner under my supervision and collaboration.  I have reviewed the Advanced Practitioner's note and chart, and I agree with the management and plan.  HARRAWAY-SMITH, Jurnie Garritano 11:58 AM     

## 2012-07-08 ENCOUNTER — Ambulatory Visit (HOSPITAL_COMMUNITY): Payer: Self-pay

## 2012-08-17 LAB — OB RESULTS CONSOLE ANTIBODY SCREEN: Antibody Screen: NEGATIVE

## 2012-08-17 LAB — OB RESULTS CONSOLE RUBELLA ANTIBODY, IGM: Rubella: IMMUNE

## 2012-08-17 LAB — OB RESULTS CONSOLE GC/CHLAMYDIA: Gonorrhea: NEGATIVE

## 2012-08-17 LAB — OB RESULTS CONSOLE ABO/RH

## 2012-09-18 ENCOUNTER — Encounter (HOSPITAL_COMMUNITY): Payer: Self-pay | Admitting: *Deleted

## 2012-09-18 ENCOUNTER — Inpatient Hospital Stay (HOSPITAL_COMMUNITY)
Admission: AD | Admit: 2012-09-18 | Discharge: 2012-09-18 | Disposition: A | Discharge status: Home / Self Care | Payer: Medicaid Other | Source: Ambulatory Visit | Attending: Obstetrics and Gynecology | Admitting: Obstetrics and Gynecology

## 2012-09-18 DIAGNOSIS — O9989 Other specified diseases and conditions complicating pregnancy, childbirth and the puerperium: Secondary | ICD-10-CM

## 2012-09-18 DIAGNOSIS — R109 Unspecified abdominal pain: Secondary | ICD-10-CM | POA: Insufficient documentation

## 2012-09-18 DIAGNOSIS — O99891 Other specified diseases and conditions complicating pregnancy: Secondary | ICD-10-CM | POA: Insufficient documentation

## 2012-09-18 DIAGNOSIS — R350 Frequency of micturition: Secondary | ICD-10-CM | POA: Insufficient documentation

## 2012-09-18 LAB — URINALYSIS, ROUTINE W REFLEX MICROSCOPIC
Glucose, UA: NEGATIVE mg/dL
Hgb urine dipstick: NEGATIVE
Protein, ur: NEGATIVE mg/dL

## 2012-09-18 LAB — CBC
Hemoglobin: 11.2 g/dL — ABNORMAL LOW (ref 12.0–15.0)
MCH: 31.6 pg (ref 26.0–34.0)
Platelets: 201 10*3/uL (ref 150–400)
RBC: 3.54 MIL/uL — ABNORMAL LOW (ref 3.87–5.11)
WBC: 9.6 10*3/uL (ref 4.0–10.5)

## 2012-09-18 LAB — WET PREP, GENITAL
Clue Cells Wet Prep HPF POC: NONE SEEN
Trich, Wet Prep: NONE SEEN
Yeast Wet Prep HPF POC: NONE SEEN

## 2012-09-18 MED ORDER — IBUPROFEN 600 MG PO TABS
600.0000 mg | ORAL_TABLET | Freq: Once | ORAL | Status: AC
  Administered 2012-09-18: 600 mg via ORAL
  Filled 2012-09-18: qty 1

## 2012-09-18 MED ORDER — PHENAZOPYRIDINE HCL 100 MG PO TABS
200.0000 mg | ORAL_TABLET | Freq: Once | ORAL | Status: AC
  Administered 2012-09-18: 200 mg via ORAL
  Filled 2012-09-18: qty 2

## 2012-09-18 MED ORDER — PHENAZOPYRIDINE HCL 200 MG PO TABS: 200.0000 mg | ORAL_TABLET | Freq: Once | ORAL | Status: DC

## 2012-09-18 MED ORDER — NITROFURANTOIN MONOHYD MACRO 100 MG PO CAPS
100.0000 mg | ORAL_CAPSULE | Freq: Once | ORAL | Status: AC
  Administered 2012-09-18: 100 mg via ORAL
  Filled 2012-09-18: qty 1

## 2012-09-18 MED ORDER — NITROFURANTOIN MONOHYD MACRO 100 MG PO CAPS: 100.0000 mg | ORAL_CAPSULE | Freq: Once | ORAL | Status: DC

## 2012-09-18 MED ORDER — IBUPROFEN 600 MG PO TABS: 600.0000 mg | ORAL_TABLET | Freq: Once | ORAL | Status: DC

## 2012-09-18 NOTE — MAU Note (Signed)
Pt states lower abd pain, suprapubic, noted x2 weeks, denies uti s/s, feels like contractions, denies n/v/d, no constipation issues. Vaginal discharge is watery, thin, non-odorous.

## 2012-09-18 NOTE — MAU Provider Note (Signed)
Chief Complaint: Abdominal Cramping   First Provider Initiated Contact with Patient 09/18/12 1506      SUBJECTIVE HPI: Tiffany Woodard is a 21 y.o. G2P1001 at [redacted]w[redacted]d by LMP who presents with intermittent suprapubic, noted x2 weeks that feels like contractions, dysuria 3 weeks ago, resolved and urinary frequency. Also reports thin, white, odorless discharge. Denies n/v/d,/c, VB.   Past Medical History  Diagnosis Date  . No pertinent past medical history    OB History    Grav Para Term Preterm Abortions TAB SAB Ect Mult Living   2 1 1       1      # Outc Date GA Lbr Len/2nd Wgt Sex Del Anes PTL Lv   1 TRM            2 CUR              Past Surgical History  Procedure Date  . Wisdom tooth extraction    History   Social History  . Marital Status: Single    Spouse Name: N/A    Number of Children: N/A  . Years of Education: N/A   Occupational History  . Not on file.   Social History Main Topics  . Smoking status: Never Smoker   . Smokeless tobacco: Never Used  . Alcohol Use: No  . Drug Use: No  . Sexually Active: Yes    Birth Control/ Protection: None   Other Topics Concern  . Not on file   Social History Narrative  . No narrative on file   No current facility-administered medications on file prior to encounter.   Current Outpatient Prescriptions on File Prior to Encounter  Medication Sig Dispense Refill  . Prenatal Vit-Fe Fumarate-FA (PRENATAL MULTIVITAMIN) TABS Take 1 tablet by mouth daily. Similac brand       No Known Allergies  ROS: Pertinent items in HPI  OBJECTIVE Blood pressure 96/56, pulse 79, temperature 98 F (36.7 C), temperature source Oral, resp. rate 16, height 5\' 2"  (1.575 m), weight 49.986 kg (110 lb 3.2 oz), last menstrual period 05/08/2012. GENERAL: Well-developed, well-nourished female in mild distress.  HEENT: Normocephalic HEART: normal rate RESP: normal effort ABDOMEN: Soft, non-tender EXTREMITIES: Nontender, no edema NEURO: Alert  and oriented SPECULUM EXAM: NEFG, physiologic discharge, no blood noted, cervix clean BIMANUAL: cervix long and closed; uterus 16-week size, no adnexal tenderness or masses FHR: 140 by doppler  LAB RESULTS Results for orders placed during the hospital encounter of 09/18/12 (from the past 24 hour(s))  URINALYSIS, ROUTINE W REFLEX MICROSCOPIC     Status: Abnormal   Collection Time   09/18/12 12:28 PM      Component Value Range   Color, Urine YELLOW  YELLOW   APPearance CLOUDY (*) CLEAR   Specific Gravity, Urine 1.015  1.005 - 1.030   pH 8.0  5.0 - 8.0   Glucose, UA NEGATIVE  NEGATIVE mg/dL   Hgb urine dipstick NEGATIVE  NEGATIVE   Bilirubin Urine NEGATIVE  NEGATIVE   Ketones, ur NEGATIVE  NEGATIVE mg/dL   Protein, ur NEGATIVE  NEGATIVE mg/dL   Urobilinogen, UA 0.2  0.0 - 1.0 mg/dL   Nitrite NEGATIVE  NEGATIVE   Leukocytes, UA NEGATIVE  NEGATIVE  CBC     Status: Abnormal   Collection Time   09/18/12  1:00 PM      Component Value Range   WBC 9.6  4.0 - 10.5 K/uL   RBC 3.54 (*) 3.87 - 5.11 MIL/uL   Hemoglobin  11.2 (*) 12.0 - 15.0 g/dL   HCT 16.1 (*) 09.6 - 04.5 %   MCV 92.7  78.0 - 100.0 fL   MCH 31.6  26.0 - 34.0 pg   MCHC 34.1  30.0 - 36.0 g/dL   RDW 40.9  81.1 - 91.4 %   Platelets 201  150 - 400 K/uL  WET PREP, GENITAL     Status: Abnormal   Collection Time   09/18/12  1:52 PM      Component Value Range   Yeast Wet Prep HPF POC NONE SEEN  NONE SEEN   Trich, Wet Prep NONE SEEN  NONE SEEN   Clue Cells Wet Prep HPF POC NONE SEEN  NONE SEEN   WBC, Wet Prep HPF POC MODERATE (*) NONE SEEN    IMAGING No results found.  MAU COURSE  ASSESSMENT 1. Abdominal pain complicating pregnancy, antepartum   2. Frequency of urination     PLAN Discharge home per consult w/ Dr. Ellyn Hack. Will Tx for UTI and culture urine. Comfort measures. Ibuprofen x 48 hours     Follow-up Information    Schedule an appointment as soon as possible for a visit with Bing Plume, MD.    Contact information:   [address], [address], [address] [place] [address] [phone]       Follow up with THE [facility] OF [place] MATERNITY ADMISSIONS. (As needed if symptoms worsen)    Contact information:   [address] [id number] mc [place] [address] [phone]          Medication List     As of 09/18/2012  6:22 PM    TAKE these medications         ibuprofen 600 MG tablet   Commonly known as: ADVIL,MOTRIN   Take 1 tablet (600 mg total) by mouth once.      nitrofurantoin (macrocrystal-monohydrate) 100 MG capsule   Commonly known as: MACROBID   Take 1 capsule (100 mg total) by mouth once.      phenazopyridine 200 MG tablet   Commonly known as: PYRIDIUM   Take 1 tablet (200 mg total) by mouth once.      prenatal multivitamin Tabs   Take 1 tablet by mouth daily. Similac brand        [place], CNM 09/18/2012  2:01 PM

## 2012-09-18 NOTE — MAU Note (Signed)
Pt reports having abd cramping on and off for the past 2 weeks . Pain has been getting worseeports it feels like her stomach is balling up and gets tighter then releases.

## 2012-09-19 LAB — URINE CULTURE: Colony Count: 4000

## 2012-10-17 ENCOUNTER — Inpatient Hospital Stay (HOSPITAL_COMMUNITY)
Admission: AD | Admit: 2012-10-17 | Discharge: 2012-10-17 | Disposition: A | Discharge status: Home / Self Care | Payer: Medicaid Other | Source: Ambulatory Visit | Attending: Obstetrics and Gynecology | Admitting: Obstetrics and Gynecology

## 2012-10-17 ENCOUNTER — Encounter (HOSPITAL_COMMUNITY): Payer: Self-pay | Admitting: Family

## 2012-10-17 DIAGNOSIS — O200 Threatened abortion: Secondary | ICD-10-CM | POA: Insufficient documentation

## 2012-10-17 DIAGNOSIS — R109 Unspecified abdominal pain: Secondary | ICD-10-CM

## 2012-10-17 DIAGNOSIS — O479 False labor, unspecified: Secondary | ICD-10-CM

## 2012-10-17 LAB — URINALYSIS, ROUTINE W REFLEX MICROSCOPIC
Glucose, UA: NEGATIVE mg/dL
Ketones, ur: NEGATIVE mg/dL
Leukocytes, UA: NEGATIVE
Protein, ur: NEGATIVE mg/dL
Urobilinogen, UA: 1 mg/dL (ref 0.0–1.0)

## 2012-10-17 MED ORDER — IBUPROFEN 800 MG PO TABS
800.0000 mg | ORAL_TABLET | Freq: Once | ORAL | Status: AC
  Administered 2012-10-17: 800 mg via ORAL
  Filled 2012-10-17: qty 1

## 2012-10-17 NOTE — MAU Provider Note (Signed)
History     CSN: [id number]  Arrival date and time: 10/17/12 4098   First Provider Initiated Contact with Patient 10/17/12 1953      Chief Complaint  Patient presents with  . Abdominal Pain   HPI Tiffany Woodard is 21 y.o. G2P1001 110w6d weeks presenting with presents with report a lot of pressure and lower abdominal discomfort today at work.  Came in by EMS.      Past Medical History  Diagnosis Date  . No pertinent past medical history     Past Surgical History  Procedure Date  . Wisdom tooth extraction     Family History  Problem Relation Age of Onset  . Other Neg Hx     History  Substance Use Topics  . Smoking status: Never Smoker   . Smokeless tobacco: Never Used  . Alcohol Use: No    Allergies: No Known Allergies  Prescriptions prior to admission  Medication Sig Dispense Refill  . cetirizine (ZYRTEC) 10 MG tablet Take 10 mg by mouth daily as needed. allergies      . Prenatal Vit-Fe Fumarate-FA (PRENATAL MULTIVITAMIN) TABS Take 1 tablet by mouth daily. Similac brand        Review of Systems  Constitutional: Negative.   HENT: Negative.   Respiratory: Negative.   Cardiovascular: Negative.   Gastrointestinal: Positive for abdominal pain (pressure).  Genitourinary: Negative for dysuria, urgency and frequency.       Neg for abnormal discharge.  Had spotting 2 hrs ago and vaginal pressure   Physical Exam   Blood pressure 111/71, pulse 81, temperature 99.3 F (37.4 C), temperature source Oral, resp. rate 18, height 5\' 2"  (1.575 m), weight 110 lb (49.896 kg), last menstrual period 05/08/2012.  Physical Exam  Constitutional: She is oriented to person, place, and time. She appears well-developed and well-nourished. No distress.  HENT:  Head: Normocephalic.  Neck: Normal range of motion.  Respiratory: Effort normal.  GI: Soft.  Genitourinary: There is no tenderness or lesion on the right labia. There is no tenderness or lesion on the left labia. Uterus  is enlarged. Uterus is not tender (tight). Cervix exhibits no friability. No bleeding around the vagina. Vaginal discharge found.       Cervix is 1cm dilated.  Neurological: She is alert and oriented to person, place, and time.  Skin: Skin is warm and dry.  Psychiatric: She has a normal mood and affect. Her behavior is normal.    MAU Course  Procedures  FHR 147    GC/CHL urine sent to lab  MDM 19:57  Called Dr. Senaida Ores and reported PHI and cervical exam.  Order to get GC/CHL on Urine.  Pelvic rest tonight and call office for ultrasound in the am.  Give 1 time dose of Motrin 800mg .  If she is feeling less pressure/discomfort after motrin may be discharged to home and instructions to call office at 8:30am for ultrasound in the office tomorrow.  Care turned over to V. Katrinka Blazing, CNM at 20:10.  Matt Holmes 10/17/2012, 7:53 PM   Pressure improved after Motrin and emptying bladder.  1. Preterm contractions    PLAN: D/C home Pelvic rest x 1 week Preterm labor precautions Follow-up Information    Follow up with Oliver Pila, MD. Schedule an appointment as soon as possible for a visit on 10/18/2012.   Contact information:   [address], [address] [place] [address] [phone]       Follow up with THE [facility]  OF [place] MATERNITY ADMISSIONS. (As needed if symptoms worsen)    Contact information:   [address] [id number] mc [place] [address] [phone]             Medication List     As of 10/17/2012  9:19 PM    CONTINUE taking these medications         cetirizine 10 MG tablet   Commonly known as: ZYRTEC      prenatal multivitamin Tabs         Dorathy Kinsman, CNM 10/17/2012 9:19 PM

## 2012-10-17 NOTE — MAU Note (Signed)
Pt presents via EMS , states she has had lower abd pressure today, "feels like i need to push", some spotting today.

## 2012-10-17 NOTE — MAU Note (Addendum)
Patient presents to MAU by EMS from work; she reports she has not felt baby move today but was at work and started to feel lower abdominal pain 10/10; feels like she needs to have bowel movement. Reports mild spotting at 1800.  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

## 2012-10-18 LAB — GC/CHLAMYDIA PROBE AMP: CT Probe RNA: NEGATIVE

## 2012-11-23 ENCOUNTER — Inpatient Hospital Stay (HOSPITAL_COMMUNITY)
Admission: AD | Admit: 2012-11-23 | Discharge: 2012-11-23 | Disposition: A | Discharge status: Home / Self Care | Payer: Medicaid Other | Source: Ambulatory Visit | Attending: Obstetrics and Gynecology | Admitting: Obstetrics and Gynecology

## 2012-11-23 ENCOUNTER — Encounter (HOSPITAL_COMMUNITY): Payer: Self-pay | Admitting: *Deleted

## 2012-11-23 DIAGNOSIS — Z3689 Encounter for other specified antenatal screening: Secondary | ICD-10-CM

## 2012-11-23 DIAGNOSIS — O479 False labor, unspecified: Secondary | ICD-10-CM

## 2012-11-23 DIAGNOSIS — O99891 Other specified diseases and conditions complicating pregnancy: Secondary | ICD-10-CM

## 2012-11-23 DIAGNOSIS — R109 Unspecified abdominal pain: Secondary | ICD-10-CM | POA: Insufficient documentation

## 2012-11-23 DIAGNOSIS — N949 Unspecified condition associated with female genital organs and menstrual cycle: Secondary | ICD-10-CM

## 2012-11-23 DIAGNOSIS — O4700 False labor before 37 completed weeks of gestation, unspecified trimester: Secondary | ICD-10-CM

## 2012-11-23 DIAGNOSIS — M549 Dorsalgia, unspecified: Secondary | ICD-10-CM

## 2012-11-23 LAB — URINALYSIS, ROUTINE W REFLEX MICROSCOPIC
Bilirubin Urine: NEGATIVE
Glucose, UA: NEGATIVE mg/dL
Ketones, ur: NEGATIVE mg/dL
Nitrite: NEGATIVE
pH: 7 (ref 5.0–8.0)

## 2012-11-23 LAB — URINE MICROSCOPIC-ADD ON

## 2012-11-23 LAB — WET PREP, GENITAL: Clue Cells Wet Prep HPF POC: NONE SEEN

## 2012-11-23 NOTE — MAU Note (Signed)
Pain x1 week, severe back pain, was here at 21 weeks for ?ptl

## 2012-11-23 NOTE — MAU Note (Signed)
Pt states she has severe back pain & pelvic pressure, had "tiny" gush of fluid this a.m., denies bleeding.

## 2012-11-23 NOTE — Discharge Instructions (Signed)
Braxton Hicks Contractions Pregnancy is commonly associated with contractions of the uterus throughout the pregnancy. Towards the end of pregnancy (32 to 34 weeks), these contractions [facility]) can develop more often and may become more forceful. This is not true labor because these contractions do not result in opening (dilatation) and thinning of the cervix. They are sometimes difficult to tell apart from true labor because these contractions can be forceful and people have different pain tolerances. You should not feel embarrassed if you go to the hospital with false labor. Sometimes, the only way to tell if you are in true labor is for your caregiver to follow the changes in the cervix. How to tell the difference between true and false labor:  False labor.  The contractions of false labor are usually shorter, irregular and not as hard as those of true labor.  They are often felt in the front of the lower abdomen and in the groin.  They may leave with walking around or changing positions while lying down.  They get weaker and are shorter lasting as time goes on.  These contractions are usually irregular.  They do not usually become progressively stronger, regular and closer together as with true labor.  True labor.  Contractions in true labor last 30 to 70 seconds, become very regular, usually become more intense, and increase in frequency.  They do not go away with walking.  The discomfort is usually felt in the top of the uterus and spreads to the lower abdomen and low back.  True labor can be determined by your caregiver with an exam. This will show that the cervix is dilating and getting thinner. If there are no prenatal problems or other health problems associated with the pregnancy, it is completely safe to be sent home with false labor and await the onset of true labor. HOME CARE INSTRUCTIONS   Keep up with your usual exercises and instructions.  Take medications as  directed.  Keep your regular prenatal appointment.  Eat and drink lightly if you think you are going into labor.  If BH contractions are making you uncomfortable:  Change your activity position from lying down or resting to walking/walking to resting.  Sit and rest in a tub of warm water.  Drink 2 to 3 glasses of water. Dehydration may cause B-H contractions.  Do slow and deep breathing several times an hour. SEEK IMMEDIATE MEDICAL CARE IF:   Your contractions continue to become stronger, more regular, and closer together.  You have a gushing, burst or leaking of fluid from the vagina.  An oral temperature above 102 F (38.9 C) develops.  You have passage of blood-tinged mucus.  You develop vaginal bleeding.  You develop continuous belly (abdominal) pain.  You have low back pain that you never had before.  You feel the baby's head pushing down causing pelvic pressure.  The baby is not moving as much as it used to. Document Released: 10/06/2005 Document Revised: 12/29/2011 Document Reviewed: 03/30/2009 [facility] Patient Information 2013 [place], [place]. Back Pain in Pregnancy Back pain during pregnancy is common. It happens in about half of all pregnancies. It is important for you and your baby that you remain active during your pregnancy.If you feel that back pain is not allowing you to remain active or sleep well, it is time to see your caregiver. Back pain may be caused by several factors related to changes during your pregnancy.Fortunately, unless you had trouble with your back before your pregnancy, the  pain is likely to get better after you deliver. Low back pain usually occurs between the fifth and seventh months of pregnancy. It can, however, happen in the first couple months. Factors that increase the risk of back problems include:   Previous back problems.  Injury to your back.  Having twins or multiple births.  A chronic cough.  Stress.  Job-related  repetitive motions.  Muscle or spinal disease in the back.  Family history of back problems, ruptured (herniated) discs, or osteoporosis.  Depression, anxiety, and panic attacks. CAUSES   When you are pregnant, your body produces a hormone called relaxin. This hormonemakes the ligaments connecting the low back and pubic bones more flexible. This flexibility allows the baby to be delivered more easily. When your ligaments are loose, your muscles need to work harder to support your back. Soreness in your back can come from tired muscles. Soreness can also come from back tissues that are irritated since they are receiving less support.  As the baby grows, it puts pressure on the nerves and blood vessels in your pelvis. This can cause back pain.  As the baby grows and gets heavier during pregnancy, the uterus pushes the stomach muscles forward and changes your center of gravity. This makes your back muscles work harder to maintain good posture. SYMPTOMS  Lumbar pain during pregnancy Lumbar pain during pregnancy usually occurs at or above the waist in the center of the back. There may be pain and numbness that radiates into your leg or foot. This is similar to low back pain experienced by non-pregnant women. It usually increases with sitting for long periods of time, standing, or repetitive lifting. Tenderness may also be present in the muscles along your upper back. Posterior pelvic pain during pregnancy Pain in the back of the pelvis is more common than lumbar pain in pregnancy. It is a deep pain felt in your side at the waistline, or across the tailbone (sacrum), or in both places. You may have pain on one or both sides. This pain can also go into the buttocks and backs of the upper thighs. Pubic and groin pain may also be present. The pain does not quickly resolve with rest, and morning stiffness may also be present. Pelvic pain during pregnancy can be brought on by most activities. A high level  of fitness before and during pregnancy may or may not prevent this problem. Labor pain is usually 1 to 2 minutes apart, lasts for about 1 minute, and involves a bearing down feeling or pressure in your pelvis. However, if you are at term with the pregnancy, constant low back pain can be the beginning of early labor, and you should be aware of this. DIAGNOSIS  X-rays of the back should not be done during the first 12 to 14 weeks of the pregnancy and only when absolutely necessary during the rest of the pregnancy. MRIs do not give off radiation and are safe during pregnancy. MRIs also should only be done when absolutely necessary. HOME CARE INSTRUCTIONS  Exercise as directed by your caregiver. Exercise is the most effective way to prevent or manage back pain. If you have a back problem, it is especially important to avoid sports that require sudden body movements. Swimming and walking are great activities.  Do not stand in one place for long periods of time.  Do not wear high heels.  Sit in chairs with good posture. Use a pillow on your lower back if necessary. Make sure your head  rests over your shoulders and is not hanging forward.  Try sleeping on your side, preferably the left side, with a pillow or two between your legs. If you are sore after a night's rest, your bedmay betoo soft.Try placing a board between your mattress and box spring.  Listen to your body when lifting.If you are experiencing pain, ask for help or try bending yourknees more so you can use your leg muscles rather than your back muscles. Squat down when picking up something from the floor. Do not bend over.  Eat a healthy diet. Try to gain weight within your caregiver's recommendations.  Use heat or cold packs 3 to 4 times a day for 15 minutes to help with the pain.  Only take over-the-counter or prescription medicines for pain, discomfort, or fever as directed by your caregiver. Sudden (acute) back pain  Use bed rest  for only the most extreme, acute episodes of back pain. Prolonged bed rest over 48 hours will aggravate your condition.  Ice is very effective for acute conditions.  Put ice in a plastic bag.  Place a towel between your skin and the bag.  Leave the ice on for 10 to 20 minutes every 2 hours, or as needed.  Using heat packs for 30 minutes prior to activities is also helpful. Continued back pain See your caregiver if you have continued problems. Your caregiver can help or refer you for appropriate physical therapy. With conditioning, most back problems can be avoided. Sometimes, a more serious issue may be the cause of back pain. You should be seen right away if new problems seem to be developing. Your caregiver may recommend:  A maternity girdle.  An elastic sling.  A back brace.  A massage therapist or acupuncture. SEEK MEDICAL CARE IF:   You are not able to do most of your daily activities, even when taking the pain medicine you were given.  You need a referral to a physical therapist or chiropractor.  You want to try acupuncture. SEEK IMMEDIATE MEDICAL CARE IF:  You develop numbness, tingling, weakness, or problems with the use of your arms or legs.  You develop severe back pain that is no longer relieved with medicines.  You have a sudden change in bowel or bladder control.  You have increasing pain in other areas of the body.  You develop shortness of breath, dizziness, or fainting.  You develop nausea, vomiting, or sweating.  You have back pain which is similar to labor pains.  You have back pain along with your water breaking or vaginal bleeding.  You have back pain or numbness that travels down your leg.  Your back pain developed after you fell.  You develop pain on one side of your back. You may have a kidney stone.  You see blood in your urine. You may have a bladder infection or kidney stone.  You have back pain with blisters. You may have  shingles. Back pain is fairly common during pregnancy but should not be accepted as just part of the process. Back pain should always be treated as soon as possible. This will make your pregnancy as pleasant as possible. Document Released: 01/14/2006 Document Revised: 12/29/2011 Document Reviewed: 02/25/2011 [facility] Patient Information 2013 [place], [place].

## 2012-11-23 NOTE — MAU Provider Note (Signed)
Chief Complaint:  Abdominal Pain and Back Pain   First Provider Initiated Contact with Patient 11/23/12 1201      HPI: Tiffany Woodard is a 22 y.o. G2P1001 at [redacted]w[redacted]d who presents to maternity admissions reporting increased pelvic pressure, back pain and LOF. The patient states that she has had pelvic pressures x 1 week that is worse today. She had one episode of "clear, watery discharge" this morning. She has had back pain today, which she rates at 10/10, while smiling and laughing. She states that she has had contractions q 5-6 minutes today. She has had occasional contractions off and on x 1 week. She denies vaginal bleeding. She has not taken any medication to help with the discomfort. She reports good fetal movement.   Past Medical History: Past Medical History  Diagnosis Date  . No pertinent past medical history     Past obstetric history: OB History    Grav Para Term Preterm Abortions TAB SAB Ect Mult Living   2 1 1       1      # Outc Date GA Lbr Len/2nd Wgt Sex Del Anes PTL Lv   1 TRM            2 CUR               Past Surgical History: Past Surgical History  Procedure Date  . Wisdom tooth extraction   . Tonsillectomy     Family History: Family History  Problem Relation Age of Onset  . Other Neg Hx     Social History: History  Substance Use Topics  . Smoking status: Never Smoker   . Smokeless tobacco: Never Used  . Alcohol Use: No    Allergies: No Known Allergies  Meds:  No prescriptions prior to admission    ROS: Pertinent findings in history of present illness.  Physical Exam  Blood pressure 97/59, pulse 89, temperature 98 F (36.7 C), temperature source Oral, resp. rate 18, last menstrual period 05/08/2012. GENERAL: Well-developed, well-nourished female in no acute distress.  HEENT: normocephalic HEART: normal rate and rhythm RESP: normal effort, clear to ausculation ABDOMEN: Soft, non-tender, gravid appropriate for gestational  age EXTREMITIES: Nontender, no edema NEURO: alert and oriented SPECULUM EXAM: Physiologic discharge, no blood, cervix clean Dilation: Closed Effacement (%): Thick Exam by:: Naaman Plummer PA  FHT:  Baseline 140 , moderate variability, accelerations present, few variable decelerations present Contractions: occasional   Labs: Results for orders placed during the hospital encounter of 11/23/12 (from the past 24 hour(s))  URINALYSIS, ROUTINE W REFLEX MICROSCOPIC     Status: Abnormal   Collection Time   11/23/12 10:25 AM      Component Value Range   Color, Urine YELLOW  YELLOW   APPearance HAZY (*) CLEAR   Specific Gravity, Urine 1.015  1.005 - 1.030   pH 7.0  5.0 - 8.0   Glucose, UA NEGATIVE  NEGATIVE mg/dL   Hgb urine dipstick NEGATIVE  NEGATIVE   Bilirubin Urine NEGATIVE  NEGATIVE   Ketones, ur NEGATIVE  NEGATIVE mg/dL   Protein, ur NEGATIVE  NEGATIVE mg/dL   Urobilinogen, UA 0.2  0.0 - 1.0 mg/dL   Nitrite NEGATIVE  NEGATIVE   Leukocytes, UA SMALL (*) NEGATIVE  URINE MICROSCOPIC-ADD ON     Status: Abnormal   Collection Time   11/23/12 10:25 AM      Component Value Range   Squamous Epithelial / LPF MANY (*) RARE   WBC, UA 11-20  <  3 WBC/hpf   RBC / HPF 3-6  <3 RBC/hpf   Bacteria, UA MANY (*) RARE   Urine-Other MUCOUS PRESENT    WET PREP, GENITAL     Status: Abnormal   Collection Time   11/23/12 12:20 PM      Component Value Range   Yeast Wet Prep HPF POC NONE SEEN  NONE SEEN   Trich, Wet Prep NONE SEEN  NONE SEEN   Clue Cells Wet Prep HPF POC NONE SEEN  NONE SEEN   WBC, Wet Prep HPF POC MANY (*) NONE SEEN    MAU Course: Discussed patient with Dr. Ellyn Hack. She feels that NST is WNL for 26w GA. She would like to discharge the patient with instructions to take Tylenol for discomfort and use heating pads and warm baths for her pain. She can follow-up in the office as scheduled next week.   Assessment: 1. Back pain in pregnancy   2. NST (non-stress test) reactive   3. Braxton Hicks  contractions     Plan: Discharge home Labor precautions and fetal kick counts discussed Patient will keep scheduled follow-up with Dr. Ellyn Hack Patient encouraged to take tylenol as needed for pain and use heat to help with back pain Patient may return to MAU as needed or if her condition should change or worsen      Follow-up Information    Follow up with BOVARD,JODY, MD. (As scheduled)    Contact information:   [address] [place] [address] [phone]       Follow up with [facility]. (As needed if symptoms worsen)    Contact information:   [address] [place] [address] [phone]          Medication List     As of 11/23/2012  1:36 PM    TAKE these medications         prenatal multivitamin Tabs   Take 1 tablet by mouth daily. Similac brand         Freddi Starr, PA-C 11/23/2012 1:36 PM

## 2012-11-23 NOTE — MAU Note (Signed)
Pt states here for vaginal discharge, no gush of fluid, no recent intercourse, severe back pain, and abd pain/pressure, increases with walking.

## 2012-11-24 LAB — URINE CULTURE

## 2013-02-01 ENCOUNTER — Inpatient Hospital Stay (HOSPITAL_COMMUNITY)
Admission: AD | Admit: 2013-02-01 | Discharge: 2013-02-01 | Disposition: A | Discharge status: Home / Self Care | Payer: Medicaid Other | Source: Ambulatory Visit | Attending: Obstetrics and Gynecology | Admitting: Obstetrics and Gynecology

## 2013-02-01 ENCOUNTER — Encounter (HOSPITAL_COMMUNITY): Payer: Self-pay | Admitting: *Deleted

## 2013-02-01 DIAGNOSIS — O4700 False labor before 37 completed weeks of gestation, unspecified trimester: Secondary | ICD-10-CM | POA: Insufficient documentation

## 2013-02-01 HISTORY — DX: Other specified health status: Z78.9

## 2013-02-01 LAB — URINALYSIS, ROUTINE W REFLEX MICROSCOPIC
Nitrite: NEGATIVE
Specific Gravity, Urine: 1.015 (ref 1.005–1.030)
Urobilinogen, UA: 1 mg/dL (ref 0.0–1.0)

## 2013-02-01 NOTE — MAU Provider Note (Signed)
[name] is a 22 y.o. G2P1001 at [redacted]w[redacted]d who presents to MAU today with contractions and ?ROM. The patient felt a gush of fluid when using the restroom upon arrival in MAU. Contractions are frequent and increasing in intensity.   BP 110/71  Pulse 90  Temp(Src) 97.1 F (36.2 C) (Oral)  Resp 18  Ht 5\' 2"  (1.575 m)  Wt 145 lb (65.772 kg)  BMI 26.51 kg/m2  LMP 05/08/2012 GENERAL: Well-developed, well-nourished female in no acute distress.  HEENT: Normocephalic, atraumatic.   LUNGS: Effort normal HEART: Regular rate  GU: Normal external genitalia. Vagina is pink and ruggated. Normal cervix contour with moderate amount of mucus discharge. No bleeding.  SKIN: Warm, dry and without erythema PSYCH: Normal mood and affect Dilation: 1.5 Effacement (%): 50 Station: -2 Exam by:: Joseph Berkshire PA  Neg - pooling Neg - ferning  RN to contact Dr. Ambrose Mantle with report  Freddi Starr, PA-C 02/01/2013 4:44 PM

## 2013-02-01 NOTE — MAU Note (Signed)
C/o labor that started about a hour ago; also c/o ? SROM about 10 minutes ago;

## 2013-02-02 ENCOUNTER — Inpatient Hospital Stay (HOSPITAL_COMMUNITY)
Admission: AD | Admit: 2013-02-02 | Discharge: 2013-02-02 | Disposition: A | Discharge status: Home / Self Care | Payer: Medicaid Other | Source: Ambulatory Visit | Attending: Obstetrics and Gynecology | Admitting: Obstetrics and Gynecology

## 2013-02-02 DIAGNOSIS — O479 False labor, unspecified: Secondary | ICD-10-CM | POA: Insufficient documentation

## 2013-02-02 MED ORDER — ZOLPIDEM TARTRATE 5 MG PO TABS
5.0000 mg | ORAL_TABLET | Freq: Once | ORAL | Status: AC
  Administered 2013-02-02: 5 mg via ORAL
  Filled 2013-02-02: qty 1

## 2013-02-02 NOTE — MAU Note (Signed)
Pt states having uc's since MN. Denies leaking of fluid or discharge.

## 2013-02-09 ENCOUNTER — Inpatient Hospital Stay (HOSPITAL_COMMUNITY)
Admission: AD | Admit: 2013-02-09 | Discharge: 2013-02-09 | Disposition: A | Discharge status: Home / Self Care | Payer: Medicaid Other | Source: Ambulatory Visit | Attending: Obstetrics and Gynecology | Admitting: Obstetrics and Gynecology

## 2013-02-09 DIAGNOSIS — R109 Unspecified abdominal pain: Secondary | ICD-10-CM | POA: Insufficient documentation

## 2013-02-09 DIAGNOSIS — N949 Unspecified condition associated with female genital organs and menstrual cycle: Secondary | ICD-10-CM | POA: Insufficient documentation

## 2013-02-09 DIAGNOSIS — O99891 Other specified diseases and conditions complicating pregnancy: Secondary | ICD-10-CM | POA: Insufficient documentation

## 2013-02-09 LAB — URINALYSIS, ROUTINE W REFLEX MICROSCOPIC
Hgb urine dipstick: NEGATIVE
Leukocytes, UA: NEGATIVE
Protein, ur: NEGATIVE mg/dL
Urobilinogen, UA: 0.2 mg/dL (ref 0.0–1.0)

## 2013-02-09 NOTE — MAU Note (Signed)
Onset of pelvic pressure since Monday, can't walk, no pain with urination, no vaginal discharge.

## 2013-02-22 ENCOUNTER — Telehealth (HOSPITAL_COMMUNITY): Payer: Self-pay | Admitting: *Deleted

## 2013-02-22 ENCOUNTER — Encounter (HOSPITAL_COMMUNITY): Payer: Self-pay | Admitting: *Deleted

## 2013-02-22 ENCOUNTER — Other Ambulatory Visit: Payer: Self-pay | Admitting: Obstetrics and Gynecology

## 2013-02-22 NOTE — Telephone Encounter (Signed)
Preadmission screen  

## 2013-02-23 ENCOUNTER — Inpatient Hospital Stay (HOSPITAL_COMMUNITY)
Admission: RE | Admit: 2013-02-23 | Discharge: 2013-02-25 | DRG: 775 | Disposition: A | Discharge status: Home / Self Care | Payer: Medicaid Other | Source: Ambulatory Visit | Attending: Obstetrics and Gynecology | Admitting: Obstetrics and Gynecology

## 2013-02-23 ENCOUNTER — Inpatient Hospital Stay (HOSPITAL_COMMUNITY): Payer: Medicaid Other | Admitting: Anesthesiology

## 2013-02-23 ENCOUNTER — Encounter (HOSPITAL_COMMUNITY): Payer: Self-pay

## 2013-02-23 ENCOUNTER — Encounter (HOSPITAL_COMMUNITY): Payer: Self-pay | Admitting: Anesthesiology

## 2013-02-23 ENCOUNTER — Telehealth (HOSPITAL_COMMUNITY): Payer: Self-pay | Admitting: *Deleted

## 2013-02-23 LAB — CBC
HCT: 32.1 % — ABNORMAL LOW (ref 36.0–46.0)
Hemoglobin: 10.5 g/dL — ABNORMAL LOW (ref 12.0–15.0)
MCH: 30.3 pg (ref 26.0–34.0)
MCHC: 32.7 g/dL (ref 30.0–36.0)
RBC: 3.47 MIL/uL — ABNORMAL LOW (ref 3.87–5.11)

## 2013-02-23 LAB — OB RESULTS CONSOLE GBS: GBS: NEGATIVE

## 2013-02-23 MED ORDER — BENZOCAINE-MENTHOL 20-0.5 % EX AERO
1.0000 "application " | INHALATION_SPRAY | CUTANEOUS | Status: DC | PRN
  Administered 2013-02-23: 1 via TOPICAL
  Filled 2013-02-23: qty 56

## 2013-02-23 MED ORDER — DIPHENHYDRAMINE HCL 25 MG PO CAPS: 25.0000 mg | ORAL_CAPSULE | Freq: Four times a day (QID) | ORAL | Status: DC | PRN

## 2013-02-23 MED ORDER — LIDOCAINE HCL (PF) 1 % IJ SOLN
INTRAMUSCULAR | Status: DC | PRN
  Administered 2013-02-23 (×2): 5 mL

## 2013-02-23 MED ORDER — FENTANYL 2.5 MCG/ML BUPIVACAINE 1/10 % EPIDURAL INFUSION (WH - ANES)
14.0000 mL/h | INTRAMUSCULAR | Status: DC | PRN
  Administered 2013-02-23: 14 mL/h via EPIDURAL
  Filled 2013-02-23: qty 125

## 2013-02-23 MED ORDER — ZOLPIDEM TARTRATE 5 MG PO TABS
5.0000 mg | ORAL_TABLET | Freq: Every evening | ORAL | Status: DC | PRN
Start: 2013-02-23 — End: 2013-02-25

## 2013-02-23 MED ORDER — LANOLIN HYDROUS EX OINT: TOPICAL_OINTMENT | CUTANEOUS | Status: DC | PRN

## 2013-02-23 MED ORDER — LIDOCAINE HCL (PF) 1 % IJ SOLN
30.0000 mL | INTRAMUSCULAR | Status: DC | PRN
  Filled 2013-02-23 (×2): qty 30

## 2013-02-23 MED ORDER — DIBUCAINE 1 % RE OINT
1.0000 "application " | TOPICAL_OINTMENT | RECTAL | Status: DC | PRN
  Administered 2013-02-23: 1 via RECTAL
  Filled 2013-02-23: qty 28

## 2013-02-23 MED ORDER — OXYTOCIN 40 UNITS IN LACTATED RINGERS INFUSION - SIMPLE MED
1.0000 m[IU]/min | INTRAVENOUS | Status: DC
  Administered 2013-02-23: 1 m[IU]/min via INTRAVENOUS

## 2013-02-23 MED ORDER — IBUPROFEN 600 MG PO TABS
600.0000 mg | ORAL_TABLET | Freq: Four times a day (QID) | ORAL | Status: DC
  Administered 2013-02-23 – 2013-02-25 (×8): 600 mg via ORAL
  Filled 2013-02-23 (×7): qty 1

## 2013-02-23 MED ORDER — DIPHENHYDRAMINE HCL 50 MG/ML IJ SOLN: 12.5000 mg | INTRAMUSCULAR | Status: DC | PRN

## 2013-02-23 MED ORDER — OXYTOCIN 40 UNITS IN LACTATED RINGERS INFUSION - SIMPLE MED: 62.5000 mL/h | INTRAVENOUS | Status: AC | PRN

## 2013-02-23 MED ORDER — TETANUS-DIPHTH-ACELL PERTUSSIS 5-2.5-18.5 LF-MCG/0.5 IM SUSP
0.5000 mL | Freq: Once | INTRAMUSCULAR | Status: AC
  Administered 2013-02-24: 0.5 mL via INTRAMUSCULAR
  Filled 2013-02-23: qty 0.5

## 2013-02-23 MED ORDER — PHENYLEPHRINE 40 MCG/ML (10ML) SYRINGE FOR IV PUSH (FOR BLOOD PRESSURE SUPPORT)
80.0000 ug | PREFILLED_SYRINGE | INTRAVENOUS | Status: DC | PRN
  Filled 2013-02-23: qty 2

## 2013-02-23 MED ORDER — MEASLES, MUMPS & RUBELLA VAC ~~LOC~~ INJ
0.5000 mL | INJECTION | Freq: Once | SUBCUTANEOUS | Status: DC
  Filled 2013-02-23: qty 0.5

## 2013-02-23 MED ORDER — PRENATAL MULTIVITAMIN CH: 1.0000 | ORAL_TABLET | Freq: Every day | ORAL | Status: DC

## 2013-02-23 MED ORDER — EPHEDRINE 5 MG/ML INJ
10.0000 mg | INTRAVENOUS | Status: DC | PRN
  Filled 2013-02-23: qty 4
  Filled 2013-02-23: qty 2

## 2013-02-23 MED ORDER — PRENATAL MULTIVITAMIN CH
1.0000 | ORAL_TABLET | Freq: Every day | ORAL | Status: DC
  Administered 2013-02-24 – 2013-02-25 (×2): 1 via ORAL
  Filled 2013-02-23 (×2): qty 1

## 2013-02-23 MED ORDER — LACTATED RINGERS IV SOLN
INTRAVENOUS | Status: DC
  Administered 2013-02-23 (×5): via INTRAVENOUS

## 2013-02-23 MED ORDER — ONDANSETRON HCL 4 MG/2ML IJ SOLN: 4.0000 mg | INTRAMUSCULAR | Status: DC | PRN

## 2013-02-23 MED ORDER — OXYTOCIN 40 UNITS IN LACTATED RINGERS INFUSION - SIMPLE MED: 62.5000 mL/h | INTRAVENOUS | Status: DC

## 2013-02-23 MED ORDER — ONDANSETRON HCL 4 MG PO TABS: 4.0000 mg | ORAL_TABLET | ORAL | Status: DC | PRN

## 2013-02-23 MED ORDER — SIMETHICONE 80 MG PO CHEW: 80.0000 mg | CHEWABLE_TABLET | ORAL | Status: DC | PRN

## 2013-02-23 MED ORDER — OXYCODONE-ACETAMINOPHEN 5-325 MG PO TABS: 1.0000 | ORAL_TABLET | ORAL | Status: DC | PRN

## 2013-02-23 MED ORDER — OXYTOCIN BOLUS FROM INFUSION: 500.0000 mL | INTRAVENOUS | Status: DC

## 2013-02-23 MED ORDER — LACTATED RINGERS IV SOLN
500.0000 mL | INTRAVENOUS | Status: DC | PRN
  Administered 2013-02-23: 1000 mL via INTRAVENOUS

## 2013-02-23 MED ORDER — EPHEDRINE 5 MG/ML INJ
10.0000 mg | INTRAVENOUS | Status: DC | PRN
  Filled 2013-02-23: qty 2

## 2013-02-23 MED ORDER — IBUPROFEN 600 MG PO TABS
600.0000 mg | ORAL_TABLET | Freq: Four times a day (QID) | ORAL | Status: DC | PRN
  Filled 2013-02-23: qty 1

## 2013-02-23 MED ORDER — LACTATED RINGERS IV SOLN: INTRAVENOUS | Status: DC

## 2013-02-23 MED ORDER — SENNOSIDES-DOCUSATE SODIUM 8.6-50 MG PO TABS
2.0000 | ORAL_TABLET | Freq: Every day | ORAL | Status: DC
  Administered 2013-02-24 (×2): 2 via ORAL

## 2013-02-23 MED ORDER — HYDROCORTISONE 1 % EX CREA
1.0000 "application " | TOPICAL_CREAM | Freq: Two times a day (BID) | CUTANEOUS | Status: DC
  Administered 2013-02-23 – 2013-02-24 (×2): 1 via TOPICAL
  Filled 2013-02-23: qty 28

## 2013-02-23 MED ORDER — WITCH HAZEL-GLYCERIN EX PADS
1.0000 "application " | MEDICATED_PAD | CUTANEOUS | Status: DC | PRN
  Administered 2013-02-23: 1 via TOPICAL

## 2013-02-23 MED ORDER — LACTATED RINGERS IV SOLN: 500.0000 mL | Freq: Once | INTRAVENOUS | Status: DC

## 2013-02-23 MED ORDER — PHENYLEPHRINE 40 MCG/ML (10ML) SYRINGE FOR IV PUSH (FOR BLOOD PRESSURE SUPPORT)
80.0000 ug | PREFILLED_SYRINGE | INTRAVENOUS | Status: DC | PRN
  Filled 2013-02-23: qty 2
  Filled 2013-02-23: qty 5

## 2013-02-23 MED ORDER — OXYCODONE-ACETAMINOPHEN 5-325 MG PO TABS
1.0000 | ORAL_TABLET | ORAL | Status: DC | PRN
  Administered 2013-02-23 – 2013-02-25 (×5): 1 via ORAL
  Filled 2013-02-23 (×5): qty 1

## 2013-02-23 NOTE — Anesthesia Procedure Notes (Signed)
Epidural Patient location during procedure: OB Start time: 02/23/2013 12:37 PM  Staffing Anesthesiologist: Angus Seller., Harrell Gave. Performed by: anesthesiologist   Preanesthetic Checklist Completed: patient identified, site marked, surgical consent, pre-op evaluation, timeout performed, IV checked, risks and benefits discussed and monitors and equipment checked  Epidural Patient position: sitting Prep: site prepped and draped and DuraPrep Patient monitoring: continuous pulse ox and blood pressure Approach: midline Injection technique: LOR air and LOR saline  Needle:  Needle type: Tuohy  Needle gauge: 17 G Needle length: 9 cm and 9 Needle insertion depth: 4 cm Catheter type: closed end flexible Catheter size: 19 Gauge Catheter at skin depth: 10 cm Test dose: negative  Assessment Events: blood not aspirated, injection not painful, no injection resistance, negative IV test and no paresthesia  Additional Notes Patient identified.  Risk benefits discussed including failed block, incomplete pain control, headache, nerve damage, paralysis, blood pressure changes, nausea, vomiting, reactions to medication both toxic or allergic, and postpartum back pain.  Patient expressed understanding and wished to proceed.  All questions were answered.  Sterile technique used throughout procedure and epidural site dressed with sterile barrier dressing. No paresthesia or other complications noted.The patient did not experience any signs of intravascular injection such as tinnitus or metallic taste in mouth nor signs of intrathecal spread such as rapid motor block. Please see nursing notes for vital signs.

## 2013-02-23 NOTE — Progress Notes (Signed)
Patient ID: Tiffany Woodard, female   DOB: 07/28/1991, 21 y.o.   MRN: [id number] Pt had become uncomfortable with her contractions so she received an epidural. Now, the cervix is 4 cm 70 % effaced and the vertex is at - 2 station. AROM PRODUCED CLEAR FLUID.

## 2013-02-23 NOTE — H&P (Signed)
Tiffany Woodard, [name]            ACCOUNT NO.:  [id number]  MEDICAL RECORD NO.:  [id number]  LOCATION:                                 FACILITY:  PHYSICIAN:  Malachi Pro. Ambrose Mantle, M.D. DATE OF BIRTH:  [birth year]  DATE OF ADMISSION: DATE OF DISCHARGE:                             HISTORY & PHYSICAL   PRESENT ILLNESS:  This is a 22 year old black female, para 1-0-0-1, gravida 2, EDC Feb 28, 2013, based on a ultrasound at 9 weeks and 1 day done on July 27, 2012.  The patient is admitted for induction.  Blood group and type O positive, negative antibody, rubella immune, RPR nonreactive, urine culture negative, hepatitis B surface antigen negative, HIV negative.  Hemoglobin, electrophoresis, AA, GC and Chlamydia negative.  Cystic fibrosis screen negative.  First trimester screen negative.  One hour Glucola 69.  The patient's prenatal course was relatively uncomplicated.  Her cervix is now relatively favorable, and she is admitted for induction.  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  She has had a tonsillectomy.  ALLERGIES:  She had no known drug allergies, and no latex allergy.  FAMILY HISTORY:  Maternal grandmother has diabetes and high blood pressure.  The patient had a vaginal delivery in May, 2012.  A 6 pounds 0 ounce female infant with epidural anesthesia.  SOCIAL HISTORY:  The patient does not drink or smoke.  She is a Consulting civil engineer at [employer], Clinical cytogeneticist.  PHYSICAL EXAMINATION:  VITAL SIGNS:  On admission, blood pressure 100/60, pulse is 80, weight 136 pounds. HEART:  Normal size and sounds.  No murmurs. LUNGS:  Clear to auscultation.  Fundal height 37 cm.  Fetal heart tones normal.  Cervix 2 cm, 40%, vertex at a -3 station.  ADMITTING IMPRESSION:  Intrauterine pregnancy at 39+ weeks, favorable cervix.  The patient is admitted for Pitocin induction of labor.     Malachi Pro. Ambrose Mantle, M.D.     TFH/MEDQ  D:  02/22/2013  T:  02/22/2013  Job:   [id number]

## 2013-02-23 NOTE — Anesthesia Preprocedure Evaluation (Signed)

## 2013-02-23 NOTE — Progress Notes (Signed)
Patient ID: Tiffany Woodard, female   DOB: 1991-07-13, 21 y.o.   MRN: [id number] Delivery note: I was called to come for delivery. After I arrived the pt made slow but steady progress and began to have severe decelerations into the 40's. With a large amount of caput showing a Kiwi VACUUM WAS APPLIED AND WITH 1 CONTRACTION AND NO POP-OFFS A LIVING FEMALE INFANT WAS DELIVERED OA mILD SHOULDER DYSTOCIA WAS MANAGED BY McRoberts ,turning the baby's head 180 degrees and grasping the left axilla. There was 1 loop of nuchal cord and the Apgars were 8 and 9 at 1 and 5 minutes. The placenta delivered intact and the uterus was normal. There were lacerations at 4 o'clock at the vaginal introitus and lateral to the urethra and inferior to the clitoris that were susured with 3-0 vicryl. EBL 400 cc's.

## 2013-02-23 NOTE — Progress Notes (Signed)
Patient ID: Tiffany Woodard, female   DOB: 29-Aug-1991, 21 y.o.   MRN: [id number] Cervix FT 30 % effaced and the vertex is at - 3 station. Will delay amniotomy

## 2013-02-24 LAB — CBC
MCH: 30 pg (ref 26.0–34.0)
MCHC: 32.5 g/dL (ref 30.0–36.0)
Platelets: 167 10*3/uL (ref 150–400)

## 2013-02-24 NOTE — Lactation Note (Signed)
This note was copied from the chart of Tiffany Redith Drach. Lactation Consultation Note Initial consult with this second-time mom; mom states she breast fed her daughter at birth, only one time, and blood came out of the breast which scared mom, and she switched to formula. Mom states that her daughter, now 22 years old, has always had GI problems, constipation, spitting up, and milk intolerance. Discussed the risks of formula feeding and asked mom if she is willing to attempt to breast feed. Mom and dad both state that they would like this baby to be breast fed based on the health benefits.  Baby then started to wiggle and show feeding cues; offered to assist mom with latch, mom accepts. Mom preferred cross cradle, left side. Assisted mom to position baby at the breast, and baby latched right on with a wide latch, rhythmic sucking, and audible swallowing. Mom states very comfortable, and mom states she really likes breastfeeding. Dad also prefers breast feeding and was supportive/ encouraging to mom to continue with breast feeding. Questions answered.  Enc mom to avoid using a paci or formula, and to allow frequent STS and cue based breast feeding. Enc mom to call for help if needed. Lactation brochure provided, mom and dad made aware of lactation services.   Patient Name: Tiffany Woodard [id number] Date: 02/24/2013 Reason for consult: Initial assessment   Maternal Data Formula Feeding for Exclusion: Yes Reason for exclusion: Mother's choice to formula and breast feed on admission Does the patient have breastfeeding experience prior to this delivery?: No (Breastfed one time and quit due to blood coming from breast.)  Feeding Feeding Type: Breast Milk Feeding method: Breast Length of feed: 20 min  LATCH Score/Interventions Latch: Grasps breast easily, tongue down, lips flanged, rhythmical sucking.  Audible Swallowing: Spontaneous and intermittent  Type of Nipple: Everted at rest and after  stimulation  Comfort (Breast/Nipple): Soft / non-tender     Hold (Positioning): Assistance needed to correctly position infant at breast and maintain latch. Intervention(s): Breastfeeding basics reviewed;Support Pillows;Position options;Skin to skin  LATCH Score: 9  Lactation Tools Discussed/Used     Consult Status Consult Status: Follow-up Follow-up type: In-patient    Octavio Manns [facility] 02/24/2013, 3:32 PM

## 2013-02-24 NOTE — Anesthesia Postprocedure Evaluation (Signed)
  Anesthesia Post-op Note  Patient: Tiffany Woodard  Procedure(s) Performed: * No procedures listed *  Patient Location: Mother/Baby  Anesthesia Type:Epidural  Level of Consciousness: awake  Airway and Oxygen Therapy: Patient Spontanous Breathing  Post-op Pain: none  Post-op Assessment: Post-op Vital signs reviewed  Post-op Vital Signs: Reviewed and stable  Complications: No apparent anesthesia complications

## 2013-02-24 NOTE — Progress Notes (Signed)
UR chart review completed.  

## 2013-02-24 NOTE — Progress Notes (Signed)
Patient ID: Tiffany Woodard, female   DOB: 02-25-1991, 21 y.o.   MRN: [id number] #[id number] afebrile BP normal no problems

## 2013-02-25 MED ORDER — IBUPROFEN 600 MG PO TABS: 600.0000 mg | ORAL_TABLET | Freq: Four times a day (QID) | ORAL | Status: DC | PRN

## 2013-02-25 MED ORDER — OXYCODONE-ACETAMINOPHEN 5-325 MG PO TABS: 1.0000 | ORAL_TABLET | Freq: Four times a day (QID) | ORAL | Status: DC | PRN

## 2013-02-25 NOTE — Progress Notes (Signed)
Patient ID: Tiffany Woodard, female   DOB: 02/17/1991, 21 y.o.   MRN: [id number] #[id number] afebrile BP normal for d/c

## 2013-02-26 NOTE — Discharge Summary (Signed)
Tiffany Woodard, Tiffany Woodard            ACCOUNT NO.:  [id number]  MEDICAL RECORD NO.:  [id number]  LOCATION:  9127                          FACILITY:  [facility]  PHYSICIAN:  Malachi Pro. Ambrose Mantle, M.D. DATE OF BIRTH:  1991/02/29  DATE OF ADMISSION:  02/23/2013 DATE OF DISCHARGE:  02/25/2013                              DISCHARGE SUMMARY   HISTORY OF PRESENT ILLNESS:  A 22 year old black female, para 1-0-0-1, gravida 2, EDC Feb 28, 2013, admitted for induction of labor.  All labs were normal.  The patient was admitted and placed on Pitocin.  At 8:12 a.m., the cervix was a fingertip, 30%, vertex at a -3 station.  The patient became uncomfortable and received an epidural.  By 1:15 p.m., the cervix was 4 cm, 70%, vertex at a -2.  Amniotomy produced clear fluid.  The patient progressed to full dilatation, made slow, but steady progress while pushing, began to have decelerations into the 40s with a large amount of kaput showing a Kiwi vacuum was applied and with 1 contraction and no pop offs.  A living female infant was delivered OA, mild shoulder dystocia was managed by McRoberts maneuver and turning the baby's head 180 degrees and grasping the left axilla.  There was one loop of nuchal cord.  The Apgars were 8 and 9 at 1 and 5 minutes.  The placenta delivered intact.  Uterus normal.  No lacerations at 4 o'clock at the vaginal introitus and lateral to the urethra and inferior to the clitoris on the right, it was sutured with 3-0 Vicryl.  Blood loss about 400 mL.  Postpartum, the patient did well and was discharged on the second postpartum day.  Initial hemoglobin was 10.5, hematocrit 32.1, white count 9800, platelet count 204,000.  Followup hemoglobin 9.9.  RPR was nonreactive.  FINAL DIAGNOSES:  Intrauterine pregnancy at 39+ weeks, delivered vertex, terminal fetal bradycardia.  OPERATION:  Vacuum-assisted vaginal delivery, repair of lacerations at 4 o'clock in the vagina and periurethrally on the  right.  FINAL CONDITION:  Improved.  INSTRUCTIONS:  Include our regular discharge instruction booklet as well as the after visit summary.  She was also given prescriptions for Motrin 600 mg and Percocet 5/325, 30 tablets of each to take 1 every 6 hours as needed for pain.  She is also advised to continue her prenatal vitamins and take ferrous sulfate 325 mg daily.     Malachi Pro. Ambrose Mantle, M.D.     TFH/MEDQ  D:  02/25/2013  T:  02/26/2013  Job:  [id number]

## 2013-03-03 ENCOUNTER — Inpatient Hospital Stay (HOSPITAL_COMMUNITY)
Admission: AD | Admit: 2013-03-03 | Discharge: 2013-03-03 | Disposition: A | Discharge status: Home / Self Care | Payer: Medicaid Other | Source: Ambulatory Visit | Attending: Obstetrics and Gynecology | Admitting: Obstetrics and Gynecology

## 2013-03-03 ENCOUNTER — Encounter (HOSPITAL_COMMUNITY): Payer: Self-pay | Admitting: *Deleted

## 2013-03-03 DIAGNOSIS — O135 Gestational [pregnancy-induced] hypertension without significant proteinuria, complicating the puerperium: Secondary | ICD-10-CM | POA: Insufficient documentation

## 2013-03-03 DIAGNOSIS — IMO0002 Reserved for concepts with insufficient information to code with codable children: Secondary | ICD-10-CM

## 2013-03-03 LAB — COMPREHENSIVE METABOLIC PANEL
AST: 17 U/L (ref 0–37)
Albumin: 2.9 g/dL — ABNORMAL LOW (ref 3.5–5.2)
Alkaline Phosphatase: 102 U/L (ref 39–117)
Chloride: 104 mEq/L (ref 96–112)
Creatinine, Ser: 0.79 mg/dL (ref 0.50–1.10)
Potassium: 3.7 mEq/L (ref 3.5–5.1)
Total Bilirubin: 0.2 mg/dL — ABNORMAL LOW (ref 0.3–1.2)

## 2013-03-03 LAB — URINE MICROSCOPIC-ADD ON

## 2013-03-03 LAB — URINALYSIS, ROUTINE W REFLEX MICROSCOPIC
Glucose, UA: NEGATIVE mg/dL
Protein, ur: NEGATIVE mg/dL
pH: 6 (ref 5.0–8.0)

## 2013-03-03 LAB — CBC
Platelets: 266 10*3/uL (ref 150–400)
RDW: 14.5 % (ref 11.5–15.5)
WBC: 8.6 10*3/uL (ref 4.0–10.5)

## 2013-03-03 MED ORDER — LABETALOL HCL 100 MG PO TABS: 100.0000 mg | ORAL_TABLET | Freq: Two times a day (BID) | ORAL | Status: DC

## 2013-03-03 MED ORDER — LABETALOL HCL 100 MG PO TABS
200.0000 mg | ORAL_TABLET | Freq: Once | ORAL | Status: AC
Start: 2013-03-03 — End: 2013-03-03
  Administered 2013-03-03: 200 mg via ORAL
  Filled 2013-03-03: qty 2

## 2013-03-03 MED ORDER — GI COCKTAIL ~~LOC~~
30.0000 mL | Freq: Once | ORAL | Status: AC
  Administered 2013-03-03: 30 mL via ORAL
  Filled 2013-03-03: qty 30

## 2013-03-03 NOTE — MAU Provider Note (Signed)
Chief Complaint: Postpartum Complications   First Provider Initiated Contact with Patient 03/03/13 2124     SUBJECTIVE HPI: Tiffany Woodard is a 22 y.o. G2P2002 at 8 days status post vaginal delivery who presents with concern that her blood pressure is elevated. Postpartum preeclampsia with her daughter 2 years ago and was readmitted for magnesium sulfate. States blood pressure normalized within a week or two after delivery and has been normal in between pregnancy and during her second pregnancy. Reports left orbital headache today that resolve spontaneously and mild pressure in her right lower chest similar to what she felt after the birth of her first child.  Past Medical History  Diagnosis Date  . No pertinent past medical history   . Medical history non-contributory    OB History   Grav Para Term Preterm Abortions TAB SAB Ect Mult Living   2 2 2       2      # Outc Date GA Lbr Len/2nd Wgt Sex Del Anes PTL Lv   1 TRM 2012 101w0d 02:00 2.722kg(6lb) F SVD EPI  Yes   2 TRM 5/14 [redacted]w[redacted]d 04:41 / 00:32 2.82kg(6lb3.5oz) M VAC EPI  Yes   Comments: na     Past Surgical History  Procedure Laterality Date  . Wisdom tooth extraction    . Tonsillectomy     History   Social History  . Marital Status: Single    Spouse Name: N/A    Number of Children: N/A  . Years of Education: N/A   Occupational History  . Not on file.   Social History Main Topics  . Smoking status: Never Smoker   . Smokeless tobacco: Never Used  . Alcohol Use: No  . Drug Use: No  . Sexually Active: Yes    Birth Control/ Protection: None   Other Topics Concern  . Not on file   Social History Narrative  . No narrative on file   No current facility-administered medications on file prior to encounter.   Current Outpatient Prescriptions on File Prior to Encounter  Medication Sig Dispense Refill  . hydrocortisone cream 1 % Apply 1 application topically 2 (two) times daily.      Marland Kitchen ibuprofen (ADVIL,MOTRIN) 600 MG  tablet Take 1 tablet (600 mg total) by mouth every 6 (six) hours as needed for pain.  30 tablet  0  . Prenatal Vit-Fe Fumarate-FA (PRENATAL MULTIVITAMIN) TABS Take 1 tablet by mouth daily. Similac brand       No Known Allergies  ROS: Positive for normal lochia.  Negative for vision changes, current headache, pedal or facial edema.  OBJECTIVE Blood pressure 166/89, pulse 5, temperature 98.7 F (37.1 C), temperature source Oral, resp. rate 18, height 5\' 2"  (1.575 m), weight 57.607 kg (127 lb), last menstrual period 05/08/2012, SpO2 99.00%. No data found. 03/03/13 2314 -- ! 50 -- 20 143/89 mmHg 03/03/13 2249 -- ! 5 -- -- 166/89 mmHg 03/03/13 2216 -- ! 51 -- -- 168/91 mmHg  03/03/13 2207 -- ! 56 -- -- 168/92 mmHg   03/03/13 2149 -- ! 53 -- -- 171/91 mmHg  03/03/13 2131 -- ! 51 -- -- 171/98 mmHg   03/03/13 2116 -- ! 52 -- -- ! 178/101 mmHg   03/03/13 2102 -- ! 52 -- -- 173/98 mmHg 03/03/13 2035 98.7 F (37.1 C) ! 51 -- 18 155/99 mmHg 99 % None (Room air)    GENERAL: Well-developed, well-nourished female in no acute distress.  HEENT: Normocephalic HEART: normal rate RESP:  normal effort ABDOMEN: Soft, non-tender EXTREMITIES: Nontender, no edema. DTRs 2+, no clonus. NEURO: Alert and oriented  LAB RESULTS Results for orders placed during the hospital encounter of 03/03/13 (from the past 24 hour(s))  URINALYSIS, ROUTINE W REFLEX MICROSCOPIC     Status: Abnormal   Collection Time    03/03/13  8:26 PM      Result Value Range   Color, Urine YELLOW  YELLOW   APPearance CLEAR  CLEAR   Specific Gravity, Urine 1.020  1.005 - 1.030   pH 6.0  5.0 - 8.0   Glucose, UA NEGATIVE  NEGATIVE mg/dL   Hgb urine dipstick LARGE (*) NEGATIVE   Bilirubin Urine NEGATIVE  NEGATIVE   Ketones, ur NEGATIVE  NEGATIVE mg/dL   Protein, ur NEGATIVE  NEGATIVE mg/dL   Urobilinogen, UA 1.0  0.0 - 1.0 mg/dL   Nitrite NEGATIVE  NEGATIVE   Leukocytes, UA LARGE (*) NEGATIVE  URINE MICROSCOPIC-ADD ON     Status:  Abnormal   Collection Time    03/03/13  8:26 PM      Result Value Range   Squamous Epithelial / LPF FEW (*) RARE   WBC, UA 21-50  <3 WBC/hpf   RBC / HPF 21-50  <3 RBC/hpf   Bacteria, UA FEW (*) RARE  CBC     Status: Abnormal   Collection Time    03/03/13  8:40 PM      Result Value Range   WBC 8.6  4.0 - 10.5 K/uL   RBC 3.83 (*) 3.87 - 5.11 MIL/uL   Hemoglobin 11.6 (*) 12.0 - 15.0 g/dL   HCT 13.0 (*) 86.5 - 78.4 %   MCV 91.6  78.0 - 100.0 fL   MCH 30.3  26.0 - 34.0 pg   MCHC 33.0  30.0 - 36.0 g/dL   RDW 69.6  29.5 - 28.4 %   Platelets 266  150 - 400 K/uL  COMPREHENSIVE METABOLIC PANEL     Status: Abnormal   Collection Time    03/03/13  8:40 PM      Result Value Range   Sodium 138  135 - 145 mEq/L   Potassium 3.7  3.5 - 5.1 mEq/L   Chloride 104  96 - 112 mEq/L   CO2 24  19 - 32 mEq/L   Glucose, Bld 85  70 - 99 mg/dL   BUN 11  6 - 23 mg/dL   Creatinine, Ser 1.32  0.50 - 1.10 mg/dL   Calcium 9.0  8.4 - 44.0 mg/dL   Total Protein 6.8  6.0 - 8.3 g/dL   Albumin 2.9 (*) 3.5 - 5.2 g/dL   AST 17  0 - 37 U/L   ALT 24  0 - 35 U/L   Alkaline Phosphatase 102  39 - 117 U/L   Total Bilirubin 0.2 (*) 0.3 - 1.2 mg/dL   GFR calc non Af Amer >90  >90 mL/min   GFR calc Af Amer >90  >90 mL/min    IMAGING No results found.  MAU COURSE  ASSESSMENT 1. Hypertension in pregnancy, delivered with postpartum condition, unspecified trimester    PLAN Discharge home in stable condition per consult with Dr. Ellyn Hack. Preeclampsia precautions. Follow-up Information   Follow up with [facility] In 3 days. (or  as needed if symptoms worsen)    Contact information:   [address] [place] [address] [phone]       Medication List    TAKE these medications  hydrocortisone cream 1 %  Apply 1 application topically 2 (two) times daily.     ibuprofen 600 MG tablet  Commonly known as:  ADVIL,MOTRIN  Take 1 tablet (600 mg total) by mouth every 6 (six)  hours as needed for pain.     labetalol 100 MG tablet  Commonly known as:  NORMODYNE  Take 1 tablet (100 mg total) by mouth 2 (two) times daily.     oxyCODONE-acetaminophen 5-325 MG per tablet  Commonly known as:  PERCOCET/ROXICET  Take 1 tablet by mouth every 6 (six) hours as needed for pain.     prenatal multivitamin Tabs  Take 1 tablet by mouth daily. Similac brand       [place], CNM 03/03/2013  11:02 PM   Addendum: 03/05/13 @ 0300: Incorrect labetalol dose prescribed. Patient should have been prescribed 200 mg by mouth twice a day. Provider sent and basket passage to notify patient of correct dose.

## 2013-03-03 NOTE — MAU Note (Signed)
Pt reports at about 1pm she started to feel some pressure  In her chest, states this happened after the birth of her daughter 2 years ago and she had to be readmitted for B/P problems. Pt is s/p vaginal delivery on 05/07. States she started having a headache so she became worried that her b/p might be up this time also.

## 2013-03-04 LAB — URINE CULTURE: Colony Count: 60000

## 2013-08-24 ENCOUNTER — Emergency Department (HOSPITAL_COMMUNITY)
Admission: EM | Admit: 2013-08-24 | Discharge: 2013-08-24 | Discharge status: Against Medical Advice | Payer: Medicaid Other | Attending: Emergency Medicine | Admitting: Emergency Medicine

## 2013-08-24 ENCOUNTER — Encounter (HOSPITAL_COMMUNITY): Payer: Self-pay | Admitting: Emergency Medicine

## 2013-08-24 ENCOUNTER — Emergency Department (HOSPITAL_COMMUNITY)
Admission: EM | Admit: 2013-08-24 | Discharge: 2013-08-24 | Disposition: A | Discharge status: Nursing Home (Includes ICF) | Payer: Medicaid Other | Source: Home / Self Care

## 2013-08-24 DIAGNOSIS — W1809XA Striking against other object with subsequent fall, initial encounter: Secondary | ICD-10-CM | POA: Insufficient documentation

## 2013-08-24 DIAGNOSIS — S0990XA Unspecified injury of head, initial encounter: Secondary | ICD-10-CM | POA: Insufficient documentation

## 2013-08-24 DIAGNOSIS — S060X0A Concussion without loss of consciousness, initial encounter: Secondary | ICD-10-CM

## 2013-08-24 DIAGNOSIS — R112 Nausea with vomiting, unspecified: Secondary | ICD-10-CM | POA: Insufficient documentation

## 2013-08-24 DIAGNOSIS — Y929 Unspecified place or not applicable: Secondary | ICD-10-CM | POA: Insufficient documentation

## 2013-08-24 DIAGNOSIS — R42 Dizziness and giddiness: Secondary | ICD-10-CM | POA: Insufficient documentation

## 2013-08-24 DIAGNOSIS — W06XXXA Fall from bed, initial encounter: Secondary | ICD-10-CM | POA: Insufficient documentation

## 2013-08-24 DIAGNOSIS — Y939 Activity, unspecified: Secondary | ICD-10-CM | POA: Insufficient documentation

## 2013-08-24 MED ORDER — IBUPROFEN 800 MG PO TABS: 800.0000 mg | ORAL_TABLET | Freq: Once | ORAL | Status: DC

## 2013-08-24 MED ORDER — ONDANSETRON 4 MG PO TBDP: 8.0000 mg | ORAL_TABLET | Freq: Once | ORAL | Status: DC

## 2013-08-24 NOTE — ED Notes (Signed)
Pt  Reports   She  Felled  And hit  Her  Head  This  Am  And      Reports     dizzyness  And she   Vomited  X  1          aT THIS  TIME  SHE  IS  SITTING  UPRIGHT ON  EXAM TABLE   SPEAKING   IN  COMPLETE  SENTANCES    HOWEVERE  SHE  STATES  SHE  FEELS  DIZZY  SHE  STATES  SHE    HAS  A  HEADACHE  AS  WELL

## 2013-08-24 NOTE — ED Notes (Signed)
Pt sent here from [facility] for eval after falling out of bed today hitting right side of head; pt c/o HA with N/V

## 2013-08-24 NOTE — ED Notes (Signed)
Pt states she has to go pick her kids up and is unable to wait.  Encouraged pt to stay for evaluation.  Explained triage process to pt and informed her that the wait she not be much longer.  Pt states she is unable to wait due to childcare and that if she starts to feel worse she will come back.  NAD at this time.

## 2013-08-24 NOTE — ED Notes (Signed)
Was asked to evaluate this patient on arrival, by front desk clerks. Pt stated she had fallen out of her bed this AM, and landed on hardwood floor. No reported LOC, no visible injury related to impact on frontal are of her head. Alert, oriented, MAEW; stable to wait until her rotation to be placed in exam room for provider. To notify front desk if her condition changes, or if she develops new syx

## 2013-08-24 NOTE — ED Provider Notes (Signed)
Chief Complaint:   Chief Complaint  Patient presents with  . Head Injury    History of Present Illness:   Tiffany Woodard is a 22 year old female who fell out of bed this morning at 6:50 AM, striking her head against a wooden floor. She hit the right parietal area on the floor. There was no loss of consciousness but she felt dizzy afterwards. About an hour later she developed nausea and vomiting which persisted up until the time she came here. At present she has a headache, vision is somewhat blurry, she is light sensitive, has felt foggy, and right side of her head feels numb and tingly. She denies any other neurological symptoms such as diplopia, bleeding from the nose or ears, neck pain, shift or broken teeth, any other injuries, extremity weakness, difficulty with speech or swallowing. She does have some unsteadiness of gait.  Review of Systems:  Other than noted above, the patient denies any of the following symptoms: Systemic:  No fever or chills. Eye:  No eye pain, redness, diplopia or blurred vision ENT:  No bleeding from nose or ears.  No loose or broken teeth. Neck:  No pain or limited ROM. GI:  No nausea or vomiting. Neuro:  No loss of consciousness, seizure activity, numbness, tingling, or weakness.  PMFSH:  Past medical history, family history, social history, meds, and allergies were reviewed. No history of anticoagulent use.   Physical Exam:   Vital signs:  BP 120/62  Pulse 56  Temp(Src) 98 F (36.7 C) (Oral)  Resp 20  SpO2 100% General:  Alert and oriented times 3.  In no distress. Eye:  PERRL, full EOMs.  Lids and conjunctivas normal. Fundi benign. HEENT:  She is tender to palpation in the right parietal area but there is no evidence of a hematoma or bump. No bruising or deformity.  TMs and canals normal, nasal mucosa normal.  No oral lacerations.  Teeth were intact without obvious oral trauma. Neck:  Non tender.  Full ROM without pain. Neurological:  Alert and  oriented.  Cranial nerves intact.  No pronator drift. She was ataxic on finger to nose left more so than right.  No muscle weakness. DTRs were symmetrical.  Sensation was intact to light touch. Gait was normal.  On Romberg testing she was unsteady and tended to fall to the left.  Able to perform tandem gait well.  Course in Urgent Care Center:   She was given ibuprofen 800 mg by mouth for the headache and Zofran ODT 8 mg sublingually for the nausea.  Assessment:  The encounter diagnosis was Concussion, without loss of consciousness, initial encounter.  She has a concussion which is complicated by persistent nausea and vomiting, persistent headache, blurry vision, light sensitivity, foggy sensation and thoughts, head numbness, and mild unsteadiness on her feet. On her examination she has some ataxia on finger to nose left more so than right and on Romberg testing she tends to fall to the left. I feel she needs further evaluation, to possibly include a CT scan.  Plan:  The patient was transferred to the ED via shuttle in stable condition.  Medical Decision Making:  22 year old female fell out of bed this morning at 6:50 a.m., striking her head against the floor.  No LOC, but felt dizzy afterwards and about 1 hour later developed vomiting that persisted up until this afternoon.  She also has blurry vision, light sensitivity, feels foggy, and is unsteady.  On exam she is  alert and oriented.  She has ataxia on finger to nose and on Romberg's maneuver.  I think she has a concussion with some complicating factors that indicate the need for further evaluation--possibly CT scan.         Reuben Likes, MD 08/24/13 [phone]

## 2013-08-24 NOTE — ED Notes (Signed)
Pt is [facility] transfer.  Pt fell and struck head.  Pt c/o dizziness, N/V, and headache.  Pt stable at [facility] and sent via shuttle for further work up.

## 2014-06-01 ENCOUNTER — Emergency Department (HOSPITAL_COMMUNITY): Payer: Medicaid Other

## 2014-06-01 ENCOUNTER — Emergency Department (HOSPITAL_COMMUNITY)
Admission: EM | Admit: 2014-06-01 | Discharge: 2014-06-02 | Disposition: A | Discharge status: Home / Self Care | Payer: Federal, State, Local not specified - Other | Attending: Emergency Medicine | Admitting: Emergency Medicine

## 2014-06-01 ENCOUNTER — Encounter (HOSPITAL_COMMUNITY): Payer: Self-pay | Admitting: Emergency Medicine

## 2014-06-01 DIAGNOSIS — T7421XA Adult sexual abuse, confirmed, initial encounter: Secondary | ICD-10-CM | POA: Insufficient documentation

## 2014-06-01 DIAGNOSIS — S20219A Contusion of unspecified front wall of thorax, initial encounter: Secondary | ICD-10-CM | POA: Insufficient documentation

## 2014-06-01 DIAGNOSIS — S20211A Contusion of right front wall of thorax, initial encounter: Secondary | ICD-10-CM

## 2014-06-01 DIAGNOSIS — M5380 Other specified dorsopathies, site unspecified: Secondary | ICD-10-CM | POA: Insufficient documentation

## 2014-06-01 DIAGNOSIS — S40021A Contusion of right upper arm, initial encounter: Secondary | ICD-10-CM

## 2014-06-01 DIAGNOSIS — IMO0002 Reserved for concepts with insufficient information to code with codable children: Secondary | ICD-10-CM | POA: Insufficient documentation

## 2014-06-01 DIAGNOSIS — S40029A Contusion of unspecified upper arm, initial encounter: Secondary | ICD-10-CM | POA: Insufficient documentation

## 2014-06-01 DIAGNOSIS — M62830 Muscle spasm of back: Secondary | ICD-10-CM

## 2014-06-01 DIAGNOSIS — F4329 Adjustment disorder with other symptoms: Secondary | ICD-10-CM | POA: Diagnosis present

## 2014-06-01 DIAGNOSIS — Y929 Unspecified place or not applicable: Secondary | ICD-10-CM | POA: Insufficient documentation

## 2014-06-01 DIAGNOSIS — Z3202 Encounter for pregnancy test, result negative: Secondary | ICD-10-CM | POA: Insufficient documentation

## 2014-06-01 DIAGNOSIS — Z79899 Other long term (current) drug therapy: Secondary | ICD-10-CM | POA: Insufficient documentation

## 2014-06-01 DIAGNOSIS — F39 Unspecified mood [affective] disorder: Secondary | ICD-10-CM | POA: Insufficient documentation

## 2014-06-01 DIAGNOSIS — Y939 Activity, unspecified: Secondary | ICD-10-CM | POA: Insufficient documentation

## 2014-06-01 HISTORY — DX: Anxiety disorder, unspecified: F41.9

## 2014-06-01 HISTORY — DX: Depression, unspecified: F32.A

## 2014-06-01 HISTORY — DX: Major depressive disorder, single episode, unspecified: F32.9

## 2014-06-01 LAB — CBC WITH DIFFERENTIAL/PLATELET
Basophils Absolute: 0 10*3/uL (ref 0.0–0.1)
Basophils Relative: 0 % (ref 0–1)
EOS ABS: 0 10*3/uL (ref 0.0–0.7)
Eosinophils Relative: 0 % (ref 0–5)
HEMATOCRIT: 40.4 % (ref 36.0–46.0)
HEMOGLOBIN: 13.6 g/dL (ref 12.0–15.0)
Lymphocytes Relative: 23 % (ref 12–46)
Lymphs Abs: 2.3 10*3/uL (ref 0.7–4.0)
MCH: 31.2 pg (ref 26.0–34.0)
MCHC: 33.7 g/dL (ref 30.0–36.0)
MCV: 92.7 fL (ref 78.0–100.0)
MONO ABS: 0.4 10*3/uL (ref 0.1–1.0)
MONOS PCT: 4 % (ref 3–12)
Neutro Abs: 7.3 10*3/uL (ref 1.7–7.7)
Neutrophils Relative %: 73 % (ref 43–77)
Platelets: 256 10*3/uL (ref 150–400)
RBC: 4.36 MIL/uL (ref 3.87–5.11)
RDW: 12.7 % (ref 11.5–15.5)
WBC: 10.1 10*3/uL (ref 4.0–10.5)

## 2014-06-01 LAB — URINALYSIS, ROUTINE W REFLEX MICROSCOPIC
Bilirubin Urine: NEGATIVE
Glucose, UA: NEGATIVE mg/dL
Hgb urine dipstick: NEGATIVE
Ketones, ur: 40 mg/dL — AB
Leukocytes, UA: NEGATIVE
Nitrite: NEGATIVE
Protein, ur: 30 mg/dL — AB
SPECIFIC GRAVITY, URINE: 1.042 — AB (ref 1.005–1.030)
Urobilinogen, UA: 1 mg/dL (ref 0.0–1.0)
pH: 5.5 (ref 5.0–8.0)

## 2014-06-01 LAB — RAPID URINE DRUG SCREEN, HOSP PERFORMED
AMPHETAMINES: NOT DETECTED
Barbiturates: NOT DETECTED
Benzodiazepines: NOT DETECTED
Cocaine: NOT DETECTED
OPIATES: NOT DETECTED
TETRAHYDROCANNABINOL: NOT DETECTED

## 2014-06-01 LAB — COMPREHENSIVE METABOLIC PANEL
ALBUMIN: 4.5 g/dL (ref 3.5–5.2)
ALT: 11 U/L (ref 0–35)
ANION GAP: 16 — AB (ref 5–15)
AST: 17 U/L (ref 0–37)
Alkaline Phosphatase: 50 U/L (ref 39–117)
BILIRUBIN TOTAL: 0.4 mg/dL (ref 0.3–1.2)
BUN: 12 mg/dL (ref 6–23)
CHLORIDE: 102 meq/L (ref 96–112)
CO2: 24 mEq/L (ref 19–32)
CREATININE: 0.92 mg/dL (ref 0.50–1.10)
Calcium: 9.7 mg/dL (ref 8.4–10.5)
GFR calc Af Amer: >90 mL/min (ref >90)
GFR, EST NON AFRICAN AMERICAN: 88 mL/min — AB (ref >90)
Glucose, Bld: 85 mg/dL (ref 70–99)
Potassium: 4 mEq/L (ref 3.7–5.3)
Sodium: 142 mEq/L (ref 137–147)
Total Protein: 8.1 g/dL (ref 6.0–8.3)

## 2014-06-01 LAB — URINE MICROSCOPIC-ADD ON

## 2014-06-01 LAB — ETHANOL: Alcohol, Ethyl (B): <11 mg/dL (ref 0–11)

## 2014-06-01 LAB — PREGNANCY, URINE: PREG TEST UR: NEGATIVE

## 2014-06-01 LAB — ACETAMINOPHEN LEVEL

## 2014-06-01 LAB — SALICYLATE LEVEL: Salicylate Lvl: 2.1 mg/dL — ABNORMAL LOW (ref 2.8–20.0)

## 2014-06-01 MED ORDER — LEVONORGESTREL 1.5 MG PO TABS
1.5000 mg | ORAL_TABLET | Freq: Once | ORAL | Status: AC
  Administered 2014-06-01: 1.5 mg via ORAL
  Filled 2014-06-01: qty 1

## 2014-06-01 MED ORDER — ACETAMINOPHEN 325 MG PO TABS
650.0000 mg | ORAL_TABLET | ORAL | Status: DC | PRN
Start: 2014-06-01 — End: 2014-06-02
  Administered 2014-06-01: 650 mg via ORAL
  Filled 2014-06-01: qty 2

## 2014-06-01 MED ORDER — HYDROXYZINE HCL 25 MG PO TABS: 50.0000 mg | ORAL_TABLET | Freq: Every evening | ORAL | Status: DC | PRN

## 2014-06-01 MED ORDER — IBUPROFEN 200 MG PO TABS
400.0000 mg | ORAL_TABLET | Freq: Once | ORAL | Status: AC
  Administered 2014-06-01: 400 mg via ORAL
  Filled 2014-06-01: qty 2

## 2014-06-01 MED ORDER — LEVONORGESTREL 0.75 MG PO TABS
0.7500 mg | ORAL_TABLET | Freq: Two times a day (BID) | ORAL | Status: DC
  Filled 2014-06-01: qty 2

## 2014-06-01 NOTE — ED Notes (Addendum)
Black pocketbook with: Pensions consultantKeys Car key [employer] student ID- loose in purse Gift card- loose in purse Security card-loose in purse  Drivers License-loose in purse Ross Storesrange toothbrush [employer] [place] debit card ending in [id number]- loose in purse USB drive pen chapstick Black iphone with black case Car insurance ID card Home DepotSS card for patient SS card for [place] [id number]  SS card for [employer] [place] 1383   Black tank top Pink hello kitty pants Blue underwear  Black chuck taylor shoes
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
## 2014-06-01 NOTE — ED Notes (Signed)
Up on the phone 
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
## 2014-06-01 NOTE — ED Notes (Signed)
SANE nurse at bedside.

## 2014-06-01 NOTE — ED Notes (Addendum)
Up to the desk on there phone
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
## 2014-06-01 NOTE — Progress Notes (Addendum)
The number that is provided for the patients mother is incorrect. The correct number was provided at the patients bedside. The correct number for "mom" is as follows: [phone]
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
## 2014-06-01 NOTE — ED Notes (Signed)
[phone] [id number] Mom

## 2014-06-02 ENCOUNTER — Encounter (HOSPITAL_COMMUNITY): Payer: Self-pay | Admitting: *Deleted

## 2014-06-02 DIAGNOSIS — F438 Other reactions to severe stress: Secondary | ICD-10-CM

## 2014-06-02 DIAGNOSIS — F4389 Other reactions to severe stress: Secondary | ICD-10-CM

## 2014-06-02 DIAGNOSIS — F4329 Adjustment disorder with other symptoms: Secondary | ICD-10-CM | POA: Diagnosis present
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
## 2014-06-02 NOTE — ED Notes (Signed)
Writer spoke with pt's mother, Victorino DikeJennifer, (consent obtained) at Dr. Remus BlakeKumar's request. Mother shared that her daughter is normally a very responsible, inteeligent person and a caring mother. She described the relationship issues b/w her daughter and her childrens father. She states she feels like the pt has been suppressing her emotions until yesterday and that it finally came out. She states she has never had a psychotic or suicidal episode in the past and has never been under the care of a physician for psychiatric issues. She does feel Kathryne HitchLondon could benefit from an anti-depressant. She states she has a good job, her kids are always fed, clean and neat. She intends to have her daughter stay with her on the weekends (babies father has the children on the weekends) for awhile and the pts best friend is going to stay with her during the week as a safety precaution/support. She does not feel that Kathryne HitchLondon was ever a danger to herself and she has no concerns about possibly discharging her today. Writer encouraged her to call 911 or go immediately to area ED if she feels like the pt is a danger to herself. She states she would not hesitate to do so.

## 2014-06-02 NOTE — ED Provider Notes (Signed)
Medical screening examination/treatment/procedure(s) were performed by non-physician practitioner and as supervising physician I was immediately available for consultation/collaboration.   EKG Interpretation None        Dicy Smigel M Miraya Cudney, MD 06/02/14 2204 
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
## 2014-06-02 NOTE — Consult Note (Signed)
Patient seen, evaluated by me, treatment plan formulated by me. Patient's mom was contacted, reports that patient has no psychiatric history and is safe to be discharged
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
## 2014-06-05 ENCOUNTER — Telehealth (HOSPITAL_BASED_OUTPATIENT_CLINIC_OR_DEPARTMENT_OTHER): Payer: Self-pay | Admitting: Emergency Medicine

## 2014-07-28 ENCOUNTER — Encounter (HOSPITAL_COMMUNITY): Payer: Self-pay | Admitting: Emergency Medicine

## 2014-07-28 ENCOUNTER — Emergency Department (HOSPITAL_COMMUNITY)
Admission: EM | Admit: 2014-07-28 | Discharge: 2014-07-28 | Disposition: A | Discharge status: Home / Self Care | Payer: Medicaid Other | Attending: Emergency Medicine | Admitting: Emergency Medicine

## 2014-07-28 DIAGNOSIS — B354 Tinea corporis: Secondary | ICD-10-CM | POA: Insufficient documentation

## 2014-07-28 DIAGNOSIS — Z8659 Personal history of other mental and behavioral disorders: Secondary | ICD-10-CM | POA: Insufficient documentation

## 2014-07-28 MED ORDER — GRISEOFULVIN MICROSIZE 500 MG PO TABS: 500.0000 mg | ORAL_TABLET | Freq: Every day | ORAL | Status: DC

## 2014-07-28 MED ORDER — CLOTRIMAZOLE 1 % EX CREA: TOPICAL_CREAM | CUTANEOUS | Status: DC

## 2014-07-28 NOTE — ED Provider Notes (Signed)
CSN: [id number]     Arrival date & time 07/28/14  1849 History  This chart was scribed for Tyler DeisJen Victoriah Wilds, PA-C, working with No att. providers found by Leona CarryG. Clay Sherrill, ED Scribe. The patient was seen in TR07C/TR07C. The patient's care was started at 7:40 PM.     Chief Complaint  Patient presents with  . Rash   HPI HPI Comments: Evonnie PatLondon M Bitton is a 23 y.o. female who presents to the Emergency Department complaining of a rash beginning two weeks ago. Patient reports that she first noticed a "scaly" bump on her left thigh two weeks ago, but states that the rash has since spread to the rest of her body. She denies any drainage from the rash. She reports that she has taken Benadryl and used hydrocortisone cream without relief of symptoms. Patient denies beginning any new lotions or medications recently. She denies nausea, vomiting, diarrhea, SOB, or lip or tongue swelling.   Patient does not have a PCP.   Past Medical History  Diagnosis Date  . No pertinent past medical history   . Medical history non-contributory   . Depression   . Anxiety    Past Surgical History  Procedure Laterality Date  . Wisdom tooth extraction    . Tonsillectomy     Family History  Problem Relation Age of Onset  . Other Neg Hx   . Heart disease Maternal Grandmother   . Hypertension Maternal Grandmother   . Diabetes Maternal Grandmother    History  Substance Use Topics  . Smoking status: Never Smoker   . Smokeless tobacco: Never Used  . Alcohol Use: No   OB History   Grav Para Term Preterm Abortions TAB SAB Ect Mult Living   2 2 2       2      Review of Systems  HENT:       Denies lip or tongue swelling.  Respiratory: Negative for shortness of breath.   Gastrointestinal: Negative for nausea and vomiting.  Skin: Positive for rash.  All other systems reviewed and are negative.     Allergies  Peanuts  Home Medications   Prior to Admission medications   Medication Sig Start Date End  Date Taking? Authorizing Provider  acetaminophen (TYLENOL) 500 MG tablet Take 500 mg by mouth every 6 (six) hours as needed for mild pain or headache.   Yes Historical Provider, MD  diphenhydrAMINE (SOMINEX) 25 MG tablet Take 25 mg by mouth at bedtime as needed for allergies or sleep.   Yes Historical Provider, MD  clotrimazole (LOTRIMIN) 1 % cream Apply to affected area 2 times daily 07/28/14   Victorino DikeJennifer L Barnabas Henriques, PA-C  griseofulvin (GRIFULVIN V) 500 MG tablet Take 1 tablet (500 mg total) by mouth daily. 07/28/14   Lise AuerJennifer L Maleeha Halls, PA-C   Triage Vitals: BP 138/81  Pulse 80  Temp(Src) 97.9 F (36.6 C) (Oral)  Resp 14  SpO2 100%  LMP 07/11/2014 Physical Exam  Nursing note and vitals reviewed. Constitutional: She is oriented to person, place, and time. She appears well-developed and well-nourished. No distress.  HENT:  Head: Normocephalic and atraumatic.  Right Ear: External ear normal.  Left Ear: External ear normal.  Nose: Nose normal.  Mouth/Throat: Oropharynx is clear and moist.  Eyes: Conjunctivae are normal.  Neck: Normal range of motion. Neck supple.  Cardiovascular: Normal rate.   Pulmonary/Chest: Effort normal.  Abdominal: Soft.  Musculoskeletal: Normal range of motion.  Neurological: She is alert and oriented to person, place,  and time.  Skin: Skin is warm and dry. Rash noted. She is not diaphoretic.  Annular lesions of varying size with central clearing.   Psychiatric: She has a normal mood and affect.    ED Course  Procedures (including critical care time) Medications - No data to display  DIAGNOSTIC STUDIES: Oxygen Saturation is 100% on room air, normal by my interpretation.    COORDINATION OF CARE: 7:46 PM-Discussed treatment plan which includes Lotrimin and Grifulvin with pt at bedside and pt agreed to plan.      Labs Review Labs Reviewed - No data to display  Imaging Review No results found.   EKG Interpretation None      MDM   Final  diagnoses:  Tinea corporis   Filed Vitals:   07/28/14 1904  BP: 138/81  Pulse: 80  Temp: 97.9 F (36.6 C)  Resp: 14   Afebrile, NAD, non-toxic appearing, AAOx4.  Patient with multiple annular lesions varying size with central clearing. This is consistent with tinea corporis. No evidence of SJS or necrotizing fasciitis. Due to pruritic and not painful nature of blisters do not suspect pemphigus vulgaris. Pustules do not resemble scabies as per pt hx or allergic reaction. No blisters, no pustules, no warmth, no draining sinus tracts, no superficial abscesses, no bullous impetigo, no vesicles, no desquamation, no target lesions with dusky purpura or a central bulla. Not tender to touch. Will place patient on lotrimin and griseofulvin given extensive case of tinea corporis.  Return precautions discussed. Patient is agreeable to plan. Patient is stable at time of discharge   I personally performed the services described in this documentation, which was scribed in my presence. The recorded information has been reviewed and is accurate.      Jeannetta EllisJennifer L Noel Rodier, PA-C 07/28/14 2254

## 2014-07-28 NOTE — Discharge Instructions (Signed)
Please follow up with your primary care physician in 1-2 days. If you do not have one please call the [facility] number listed above. Please take medications as prescribed. Please read all discharge instructions and return precautions.    Body Ringworm Ringworm (tinea corporis) is a fungal infection of the skin on the body. This infection is not caused by worms, but is actually caused by a fungus. Fungus normally lives on the top of your skin and can be useful. However, in the case of ringworms, the fungus grows out of control and causes a skin infection. It can involve any area of skin on the body and can spread easily from one person to another (contagious). Ringworm is a common problem for children, but it can affect adults as well. Ringworm is also often found in athletes, especially wrestlers who share equipment and mats.  CAUSES  Ringworm of the body is caused by a fungus called dermatophyte. It can spread by:  Touchingother people who are infected.  Touchinginfected pets.  Touching or sharingobjects that have been in contact with the infected person or pet (hats, combs, towels, clothing, sports equipment). SYMPTOMS   Itchy, raised red spots and bumps on the skin.  Ring-shaped rash.  Redness near the border of the rash with a clear center.  Dry and scaly skin on or around the rash. Not every person develops a ring-shaped rash. Some develop only the red, scaly patches. DIAGNOSIS  Most often, ringworm can be diagnosed by performing a skin exam. Your caregiver may choose to take a skin scraping from the affected area. The sample will be examined under the microscope to see if the fungus is present.  TREATMENT  Body ringworm may be treated with a topical antifungal cream or ointment. Sometimes, an antifungal shampoo that can be used on your body is prescribed. You may be prescribed antifungal medicines to take by mouth if your ringworm is severe, keeps coming back,  or lasts a long time.  HOME CARE INSTRUCTIONS   Only take over-the-counter or prescription medicines as directed by your caregiver.  Wash the infected area and dry it completely before applying yourcream or ointment.  When using antifungal shampoo to treat the ringworm, leave the shampoo on the body for 3-5 minutes before rinsing.   Wear loose clothing to stop clothes from rubbing and irritating the rash.  Wash or change your bed sheets every night while you have the rash.  Have your pet treated by your veterinarian if it has the same infection. To prevent ringworm:   Practice good hygiene.  Wear sandals or shoes in public places and showers.  Do not share personal items with others.  Avoid touching red patches of skin on other people.  Avoid touching pets that have bald spots or wash your hands after doing so. SEEK MEDICAL CARE IF:   Your rash continues to spread after 7 days of treatment.  Your rash is not gone in 4 weeks.  The area around your rash becomes red, warm, tender, and swollen. Document Released: 10/03/2000 Document Revised: 06/30/2012 Document Reviewed: 04/19/2012 [facility] Patient Information 2015 [employer], [place]. This information is not intended to replace advice given to you by your health care provider. Make sure you discuss any questions you have with your health care provider.

## 2014-07-28 NOTE — ED Notes (Signed)
Pt reports rash to torso, arms and legs x 2 weeks, causing itching. No relief with otc creams. No acute distress noted at triage.

## 2014-07-29 NOTE — ED Provider Notes (Signed)
Medical screening examination/treatment/procedure(s) were performed by non-physician practitioner and as supervising physician I was immediately available for consultation/collaboration.   EKG Interpretation None       Doug SouSam Ezekial Arns, MD 07/29/14 0009

## 2014-08-21 ENCOUNTER — Encounter (HOSPITAL_COMMUNITY): Payer: Self-pay | Admitting: Emergency Medicine

## 2014-09-07 ENCOUNTER — Emergency Department (HOSPITAL_COMMUNITY)
Admission: EM | Admit: 2014-09-07 | Discharge: 2014-09-07 | Disposition: A | Discharge status: Home / Self Care | Payer: Self-pay | Attending: Emergency Medicine | Admitting: Emergency Medicine

## 2014-09-07 ENCOUNTER — Encounter (HOSPITAL_COMMUNITY): Payer: Self-pay | Admitting: Emergency Medicine

## 2014-09-07 DIAGNOSIS — J069 Acute upper respiratory infection, unspecified: Secondary | ICD-10-CM | POA: Insufficient documentation

## 2014-09-07 DIAGNOSIS — Z79899 Other long term (current) drug therapy: Secondary | ICD-10-CM | POA: Insufficient documentation

## 2014-09-07 DIAGNOSIS — N63 Unspecified lump in breast: Secondary | ICD-10-CM | POA: Insufficient documentation

## 2014-09-07 DIAGNOSIS — Z8659 Personal history of other mental and behavioral disorders: Secondary | ICD-10-CM | POA: Insufficient documentation

## 2014-09-07 DIAGNOSIS — N6320 Unspecified lump in the left breast, unspecified quadrant: Secondary | ICD-10-CM

## 2014-09-07 DIAGNOSIS — Z87898 Personal history of other specified conditions: Secondary | ICD-10-CM | POA: Insufficient documentation

## 2014-09-07 DIAGNOSIS — R079 Chest pain, unspecified: Secondary | ICD-10-CM | POA: Insufficient documentation

## 2014-09-07 LAB — BASIC METABOLIC PANEL
Anion gap: 13 (ref 5–15)
BUN: 9 mg/dL (ref 6–23)
CHLORIDE: 102 meq/L (ref 96–112)
CO2: 24 mEq/L (ref 19–32)
CREATININE: 0.88 mg/dL (ref 0.50–1.10)
Calcium: 9.3 mg/dL (ref 8.4–10.5)
GFR calc Af Amer: >90 mL/min (ref >90)
GFR calc non Af Amer: >90 mL/min (ref >90)
GLUCOSE: 97 mg/dL (ref 70–99)
POTASSIUM: 4.1 meq/L (ref 3.7–5.3)
Sodium: 139 mEq/L (ref 137–147)

## 2014-09-07 LAB — CBC
HCT: 36 % (ref 36.0–46.0)
HEMOGLOBIN: 11.9 g/dL — AB (ref 12.0–15.0)
MCH: 30.6 pg (ref 26.0–34.0)
MCHC: 33.1 g/dL (ref 30.0–36.0)
MCV: 92.5 fL (ref 78.0–100.0)
Platelets: 245 10*3/uL (ref 150–400)
RBC: 3.89 MIL/uL (ref 3.87–5.11)
RDW: 13.5 % (ref 11.5–15.5)
WBC: 9.1 10*3/uL (ref 4.0–10.5)

## 2014-09-07 LAB — I-STAT TROPONIN, ED: Troponin i, poc: 0 ng/mL (ref 0.00–0.08)

## 2014-09-07 MED ORDER — NAPROXEN 375 MG PO TABS: 375.0000 mg | ORAL_TABLET | Freq: Two times a day (BID) | ORAL | Status: DC

## 2014-09-07 NOTE — ED Notes (Signed)
Pt reports she found lump in L breast 2 days ago. Today she started having mid to left sided chest pain with sob. C/o cough x 2 days.

## 2014-09-07 NOTE — Discharge Instructions (Signed)
Try ice to the sore muscles for pain and then use heat to help the muscle relax. Take the naproxen (also called aleve 2 tabs OTC twice a day) for pain. You should hear from the breast center about the mammogram appointment. You can follow up with Dr Dwain SarnaWakefield, a surgeon who specializes with breast issues after your mammogram.  You can take any OTC cough medications that you wish, drink plenty of fluids. Recheck if you get a high fever, struggle to breathe, or still coughing after another week.

## 2014-09-07 NOTE — ED Provider Notes (Signed)
CSN: [id number]     Arrival date & time 09/07/14  2016 History   First MD Initiated Contact with Patient 09/07/14 2147     Chief Complaint  Patient presents with  . Chest Pain  . Breast Pain     (Consider location/radiation/quality/duration/timing/severity/associated sxs/prior Treatment) HPI  Patient reports she started having central chest pain today that she describes as aching and comes and goes throughout the day. She states it will last 30 minutes. She states sometimes deep breathing will make it worse. She states nothing makes it feel better. She also started having a dry cough today without fever. She has a mild sore throat. She denies rhinorrhea. She states she has some shortness of breath earlier today while she was having a coughing spell otherwise she does not have shortness of breath. She has never had this before. She does say yesterday she did more advanced and prolonged yoga moves and a lot of them involved balancing herself on her head. She states she is sore all over. She has been doing yoga for awhile.   Patient also notes a couple days ago she did a self breast exam for the first time and she noted a small mobile mass in her left lateral breast. She does not have pain. She does not have nipple discharge. She denies any family history of breast problems were breast cancer.  PCP none  Past Medical History  Diagnosis Date  . No pertinent past medical history   . Medical history non-contributory   . Depression   . Anxiety    Past Surgical History  Procedure Laterality Date  . Wisdom tooth extraction    . Tonsillectomy     Family History  Problem Relation Age of Onset  . Other Neg Hx   . Heart disease Maternal Grandmother   . Hypertension Maternal Grandmother   . Diabetes Maternal Grandmother    History  Substance Use Topics  . Smoking status: Never Smoker   . Smokeless tobacco: Never Used  . 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     Allergies  Peanuts  Home Medications   Prior to Admission medications   Medication Sig Start Date End Date Taking? Authorizing Provider  acetaminophen (TYLENOL) 500 MG tablet Take 500 mg by mouth every 6 (six) hours as needed for mild pain or headache.   Yes Historical Provider, MD  clotrimazole (LOTRIMIN) 1 % cream Apply to affected area 2 times daily 07/28/14   Victorino DikeJennifer L Piepenbrink, PA-C  diphenhydrAMINE (SOMINEX) 25 MG tablet Take 25 mg by mouth at bedtime as needed for allergies or sleep.    Historical Provider, MD  griseofulvin (GRIFULVIN V) 500 MG tablet Take 1 tablet (500 mg total) by mouth daily. 07/28/14   Jennifer L Piepenbrink, PA-C   BP 109/78 mmHg  Pulse 58  Temp(Src) 98.3 F (36.8 C)  Resp 12  SpO2 100%  LMP 08/30/2014 (Exact Date)  Vital signs normal    Physical Exam  Constitutional: She is oriented to person, place, and time. She appears well-developed and well-nourished.  Non-toxic appearance. She does not appear ill. No distress.  HENT:  Head: Normocephalic and atraumatic.  Right Ear: External ear normal.  Left Ear: External ear normal.  Nose: Nose normal. No mucosal edema or rhinorrhea.  Mouth/Throat: Oropharynx is clear and moist and mucous membranes are normal. No dental abscesses or uvula swelling.  Eyes: Conjunctivae and EOM are normal. Pupils are equal, round, and reactive to light.  Neck: Normal range of motion and full passive range of motion without pain. Neck supple.  Cardiovascular: Normal rate, regular rhythm and normal heart sounds.  Exam reveals no gallop and no friction rub.   No murmur heard. Pulmonary/Chest: Effort normal and breath sounds normal. No respiratory distress. She has no wheezes. She has no rhonchi. She has no rales. She exhibits tenderness. She exhibits no crepitus.     Patient has diffuse tenderness to her anterior chest wall. There is a small BB sized freely movable mass in her left lateral breast that is nontender. She also has some fibrous feeling breast tissue. Her nipples are normal without eversion or drainage.  Abdominal: Soft. Normal appearance and bowel sounds are normal. She exhibits no distension. There is no tenderness. There is no rebound and no guarding.  Musculoskeletal: Normal range of motion. She exhibits no edema or tenderness.  Moves all extremities well.   Neurological: She is alert and oriented to person, place, and time. She has normal strength. No cranial nerve deficit.  Skin: Skin is warm, dry and intact. No rash noted. No erythema. No pallor.  Psychiatric: She has a normal mood and affect. Her speech is normal and behavior is normal. Her mood appears not anxious.  Nursing note and vitals reviewed.   ED Course  Procedures (including critical care time)  We went over patient's test results. We discussed she has a URI and at this time antibiotics are not indicated. She should be reevaluated if she gets a high fever she still coughing and another 7-10 days. I have ordered a mammogram for her as an outpatient and she was given the number for Dr. Dwain SarnaWakefield to follow-up on her breast mass. It feels like a BB in her breast tissue that is freely mobile.     Labs Review   Imaging Review No results found.   EKG Interpretation   Date/Time:  Thursday September 07 2014 20:19:58 EST Ventricular Rate:  72 PR Interval:  130 QRS Duration: 78 QT Interval:  382 QTC Calculation: 418 R Axis:   61 Text Interpretation:  Normal sinus rhythm Normal ECG No old tracing to  compare Confirmed by Jestina Stephani  MD-I, Dario Yono ([id number]) on 09/07/2014 10:54:42 PM      MDM   Final diagnoses:  Left breast mass  Upper respiratory infection    Discharge Medication List as of 09/07/2014 10:51 PM    START taking these medications   Details  naproxen  (NAPROSYN) 375 MG tablet Take 1 tablet (375 mg total) by mouth 2 (two) times daily., Starting 09/07/2014, Until Discontinued, Print        Plan discharge  Devoria AlbeIva Lashena Signer, MD, Franz DellFACEP     Nakiea Metzner L Tajae Rybicki, MD 09/07/14 [phone]

## 2014-09-11 ENCOUNTER — Other Ambulatory Visit (HOSPITAL_COMMUNITY): Payer: Self-pay | Admitting: Emergency Medicine

## 2014-09-11 DIAGNOSIS — N6320 Unspecified lump in the left breast, unspecified quadrant: Secondary | ICD-10-CM

## 2015-03-02 ENCOUNTER — Encounter (HOSPITAL_COMMUNITY): Payer: Self-pay | Admitting: Emergency Medicine

## 2015-03-02 ENCOUNTER — Emergency Department (HOSPITAL_COMMUNITY): Payer: 59

## 2015-03-02 ENCOUNTER — Emergency Department (HOSPITAL_COMMUNITY)
Admission: EM | Admit: 2015-03-02 | Discharge: 2015-03-02 | Disposition: A | Discharge status: Home / Self Care | Payer: 59 | Attending: Emergency Medicine | Admitting: Emergency Medicine

## 2015-03-02 DIAGNOSIS — Z3202 Encounter for pregnancy test, result negative: Secondary | ICD-10-CM | POA: Diagnosis not present

## 2015-03-02 DIAGNOSIS — W109XXA Fall (on) (from) unspecified stairs and steps, initial encounter: Secondary | ICD-10-CM | POA: Insufficient documentation

## 2015-03-02 DIAGNOSIS — Z8659 Personal history of other mental and behavioral disorders: Secondary | ICD-10-CM | POA: Diagnosis not present

## 2015-03-02 DIAGNOSIS — S0990XA Unspecified injury of head, initial encounter: Secondary | ICD-10-CM | POA: Diagnosis present

## 2015-03-02 DIAGNOSIS — S199XXA Unspecified injury of neck, initial encounter: Secondary | ICD-10-CM | POA: Diagnosis not present

## 2015-03-02 DIAGNOSIS — N390 Urinary tract infection, site not specified: Secondary | ICD-10-CM | POA: Diagnosis not present

## 2015-03-02 DIAGNOSIS — Y929 Unspecified place or not applicable: Secondary | ICD-10-CM | POA: Insufficient documentation

## 2015-03-02 DIAGNOSIS — Y990 Civilian activity done for income or pay: Secondary | ICD-10-CM | POA: Insufficient documentation

## 2015-03-02 DIAGNOSIS — S4992XA Unspecified injury of left shoulder and upper arm, initial encounter: Secondary | ICD-10-CM | POA: Diagnosis not present

## 2015-03-02 DIAGNOSIS — T1490XA Injury, unspecified, initial encounter: Secondary | ICD-10-CM

## 2015-03-02 DIAGNOSIS — Y9389 Activity, other specified: Secondary | ICD-10-CM | POA: Diagnosis not present

## 2015-03-02 DIAGNOSIS — R001 Bradycardia, unspecified: Secondary | ICD-10-CM | POA: Diagnosis not present

## 2015-03-02 DIAGNOSIS — R55 Syncope and collapse: Secondary | ICD-10-CM | POA: Diagnosis not present

## 2015-03-02 LAB — I-STAT CHEM 8, ED
BUN: 7 mg/dL (ref 6–20)
CHLORIDE: 104 mmol/L (ref 101–111)
Calcium, Ion: 1.24 mmol/L — ABNORMAL HIGH (ref 1.12–1.23)
Creatinine, Ser: 0.8 mg/dL (ref 0.44–1.00)
GLUCOSE: 83 mg/dL (ref 65–99)
HCT: 41 % (ref 36.0–46.0)
HEMOGLOBIN: 13.9 g/dL (ref 12.0–15.0)
Potassium: 3.9 mmol/L (ref 3.5–5.1)
SODIUM: 141 mmol/L (ref 135–145)
TCO2: 20 mmol/L (ref 0–100)

## 2015-03-02 LAB — CBC
HCT: 36 % (ref 36.0–46.0)
HEMOGLOBIN: 11.9 g/dL — AB (ref 12.0–15.0)
MCH: 30.5 pg (ref 26.0–34.0)
MCHC: 33.1 g/dL (ref 30.0–36.0)
MCV: 92.3 fL (ref 78.0–100.0)
Platelets: 252 10*3/uL (ref 150–400)
RBC: 3.9 MIL/uL (ref 3.87–5.11)
RDW: 13.8 % (ref 11.5–15.5)
WBC: 6.8 10*3/uL (ref 4.0–10.5)

## 2015-03-02 LAB — URINALYSIS, ROUTINE W REFLEX MICROSCOPIC
Bilirubin Urine: NEGATIVE
Glucose, UA: NEGATIVE mg/dL
KETONES UR: NEGATIVE mg/dL
Nitrite: NEGATIVE
Protein, ur: NEGATIVE mg/dL
SPECIFIC GRAVITY, URINE: 1.01 (ref 1.005–1.030)
Urobilinogen, UA: 1 mg/dL (ref 0.0–1.0)
pH: 7 (ref 5.0–8.0)

## 2015-03-02 LAB — URINE MICROSCOPIC-ADD ON

## 2015-03-02 LAB — POC URINE PREG, ED: PREG TEST UR: NEGATIVE

## 2015-03-02 MED ORDER — HYDROCODONE-ACETAMINOPHEN 5-325 MG PO TABS: 2.0000 | ORAL_TABLET | ORAL | Status: DC | PRN

## 2015-03-02 MED ORDER — IBUPROFEN 800 MG PO TABS: 800.0000 mg | ORAL_TABLET | Freq: Three times a day (TID) | ORAL | Status: DC

## 2015-03-02 MED ORDER — ACETAMINOPHEN 325 MG PO TABS
650.0000 mg | ORAL_TABLET | Freq: Once | ORAL | Status: AC
  Administered 2015-03-02: 650 mg via ORAL
  Filled 2015-03-02: qty 2

## 2015-03-02 MED ORDER — OXYCODONE-ACETAMINOPHEN 5-325 MG PO TABS
2.0000 | ORAL_TABLET | Freq: Once | ORAL | Status: AC
  Administered 2015-03-02: 2 via ORAL
  Filled 2015-03-02: qty 2

## 2015-03-02 MED ORDER — CEPHALEXIN 500 MG PO CAPS: 500.0000 mg | ORAL_CAPSULE | Freq: Four times a day (QID) | ORAL | Status: DC

## 2015-03-02 NOTE — Discharge Instructions (Signed)
Please call your doctor for a followup appointment within 24-48 hours. When you talk to your doctor please let them know that you were seen in the emergency department and have them acquire all of your records so that they can discuss the findings with you and formulate a treatment plan to fully care for your new and ongoing problems. ° °

## 2015-03-02 NOTE — ED Notes (Signed)
Pt returned from CT °

## 2015-03-02 NOTE — ED Notes (Signed)
Pt ambulated to the bathroom with ease 

## 2015-03-02 NOTE — ED Provider Notes (Signed)
CSN: [id number]     Arrival date & time 03/02/15  1131 History   First MD Initiated Contact with Patient 03/02/15 1132     Chief Complaint  Patient presents with  . Loss of Consciousness  . Fall     (Consider location/radiation/quality/duration/timing/severity/associated sxs/prior Treatment) HPI Comments: The patient is a 24 year old female, she reports having no chronic medical problems, she takes occasional Benadryl as needed for allergic reactions and knows that she has a severe allergy to peanuts. Today while she was at work she was sitting near somebody who was eating peanuts at which time she started to feel like she was having difficulty breathing. She walked out to her car to get some Benadryl and as she went downstairs (walking beside a coworker) she had a syncopal event falling down several stairs and landing on the ground. The coworker reported that she did not break her fall, fell face first, complains of headache and neck pain. The patient states that usually she develops hives, she did not develop any rash this time but developed some difficulty breathing. At this time the symptoms have resolved. In route to the hospital the paramedics noted her blood sugar to be 119, her vital signs were otherwise normal. The patient denies abdominal pain, swelling, rashes, visual changes, difficulty with speech, she has had palpitations prior to her fall after the exposure to peanuts.  Patient is a 24 y.o. female presenting with syncope and fall. The history is provided by the patient and the EMS personnel.  Loss of Consciousness Fall    Past Medical History  Diagnosis Date  . No pertinent past medical history   . Medical history non-contributory   . Depression   . Anxiety    Past Surgical History  Procedure Laterality Date  . Wisdom tooth extraction    . Tonsillectomy     Family History  Problem Relation Age of Onset  . Other Neg Hx   . Heart disease Maternal Grandmother   .  Hypertension Maternal Grandmother   . Diabetes Maternal Grandmother    History  Substance Use Topics  . Smoking status: Never Smoker   . Smokeless tobacco: Never Used  . Alcohol Use: No   OB History    Gravida Para Term Preterm AB TAB SAB Ectopic Multiple Living   Review of Systems  Cardiovascular: Positive for syncope.  All other systems reviewed and are negative.     Allergies  Peanuts  Home Medications   Prior to Admission medications   Medication Sig Start Date End Date Taking? Authorizing Provider  acetaminophen (TYLENOL) 500 MG tablet Take 500 mg by mouth every 6 (six) hours as needed for mild pain or headache.   Yes Historical Provider, MD  diphenhydrAMINE (SOMINEX) 25 MG tablet Take 25 mg by mouth at bedtime as needed for allergies or sleep.   Yes Historical Provider, MD  cephALEXin (KEFLEX) 500 MG capsule Take 1 capsule (500 mg total) by mouth 4 (four) times daily. 03/02/15   Eber Hong, MD  clotrimazole (LOTRIMIN) 1 % cream Apply to affected area 2 times daily Patient not taking: Reported on 03/02/2015 07/28/14   Francee Piccolo, PA-C  griseofulvin (GRIFULVIN V) 500 MG tablet Take 1 tablet (500 mg total) by mouth daily. Patient not taking: Reported on 03/02/2015 07/28/14   Francee Piccolo, PA-C  HYDROcodone-acetaminophen (NORCO/VICODIN) 5-325 MG per tablet Take 2 tablets by mouth every 4 (four) hours  as needed. 03/02/15   Eber HongBrian Jemia Fata, MD  ibuprofen (ADVIL,MOTRIN) 800 MG tablet Take 1 tablet (800 mg total) by mouth 3 (three) times daily. 03/02/15   Eber HongBrian Heatherly Stenner, MD  naproxen (NAPROSYN) 375 MG tablet Take 1 tablet (375 mg total) by mouth 2 (two) times daily. Patient not taking: Reported on 03/02/2015 09/07/14   Devoria AlbeIva Knapp, MD   BP 124/83 mmHg  Pulse 56  Temp(Src) 98.4 F (36.9 C) (Oral)  Resp 18  SpO2 100%  LMP 02/28/2015 Physical Exam  Constitutional: She appears well-developed and well-nourished. No distress.  HENT:  Head:  Normocephalic and atraumatic.  Mouth/Throat: Oropharynx is clear and moist. No oropharyngeal exudate.  No hematomas lacerations or tenderness to palpation over the scalp or the face, no malocclusion hemotympanum, battle sign, raccoon eyes.  Eyes: Conjunctivae and EOM are normal. Pupils are equal, round, and reactive to light. Right eye exhibits no discharge. Left eye exhibits no discharge. No scleral icterus.  Neck: No JVD present. No thyromegaly present.  Tender to palpation over the posterior spinal as well as the left paraspinal muscles  Cardiovascular: Normal rate, regular rhythm, normal heart sounds and intact distal pulses.  Exam reveals no gallop and no friction rub.   No murmur heard. Pulmonary/Chest: Effort normal and breath sounds normal. No respiratory distress. She has no wheezes. She has no rales. She exhibits no tenderness.  Abdominal: Soft. Bowel sounds are normal. She exhibits no distension and no mass. There is no tenderness.  Musculoskeletal: Normal range of motion. She exhibits no edema or tenderness.  Full range of motion of all 4 extremities and all major joints, joints supple, compartments soft, minimal tenderness around the left shoulder with range of motion, no asymmetry swelling or bruising  Lymphadenopathy:    She has no cervical adenopathy.  Neurological: She is alert. Coordination normal.  Follows commands without difficulty, normal speech and coordination, normal strength in all 4 extremities  Skin: Skin is warm and dry. No rash noted. No erythema.  Psychiatric: She has a normal mood and affect. Her behavior is normal.  Nursing note and vitals reviewed.   ED Course  Procedures (including critical care time) Labs Review Labs Reviewed  CBC - Abnormal; Notable for the following:    Hemoglobin 11.9 (*)    All other components within normal limits  URINALYSIS, ROUTINE W REFLEX MICROSCOPIC - Abnormal; Notable for the following:    APPearance TURBID (*)    Hgb  urine dipstick SMALL (*)    Leukocytes, UA LARGE (*)    All other components within normal limits  URINE MICROSCOPIC-ADD ON - Abnormal; Notable for the following:    Squamous Epithelial / LPF MANY (*)    Bacteria, UA MANY (*)    All other components within normal limits  I-STAT CHEM 8, ED - Abnormal; Notable for the following:    Calcium, Ion 1.24 (*)    All other components within normal limits  URINE CULTURE  POC URINE PREG, ED    Imaging Review Ct Head Wo Contrast  03/02/2015   CLINICAL DATA:  24 year old female with syncope during allergic reaction to peanuts today. Loss of consciousness. Initial encounter.  EXAM: CT HEAD WITHOUT CONTRAST  CT CERVICAL SPINE WITHOUT CONTRAST  TECHNIQUE: Multidetector CT imaging of the head and cervical spine was performed following the standard protocol without intravenous contrast. Multiplanar CT image reconstructions of the cervical spine were also generated.  COMPARISON:  None.  FINDINGS: CT HEAD FINDINGS  Visualized paranasal sinuses and mastoids  are clear. Visualized orbits and scalp soft tissues are within normal limits. No osseous abnormality identified.  Cerebral volume is normal. No midline shift, ventriculomegaly, mass effect, evidence of mass lesion, intracranial hemorrhage or evidence of cortically based acute infarction. Gray-white matter differentiation is within normal limits throughout the brain.  CT CERVICAL SPINE FINDINGS  Mild straightening of cervical lordosis. Visualized skull base is intact. No atlanto-occipital dissociation. Cervicothoracic junction alignment is within normal limits. Bilateral posterior element alignment is within normal limits. No cervical spine fracture. Visible upper thoracic levels appear intact.  Negative lung apices. Negative noncontrast paraspinal soft tissues. Visible pharyngeal and laryngeal soft tissue contours are within normal limits.  IMPRESSION: 1.  Normal noncontrast CT appearance of the brain. 2. No acute  fracture or listhesis identified in the cervical spine. Ligamentous injury is not excluded.   Electronically Signed   By: Odessa Fleming M.D.   On: 03/02/2015 12:42   Ct Cervical Spine Wo Contrast  03/02/2015   CLINICAL DATA:  24 year old female with syncope during allergic reaction to peanuts today. Loss of consciousness. Initial encounter.  EXAM: CT HEAD WITHOUT CONTRAST  CT CERVICAL SPINE WITHOUT CONTRAST  TECHNIQUE: Multidetector CT imaging of the head and cervical spine was performed following the standard protocol without intravenous contrast. Multiplanar CT image reconstructions of the cervical spine were also generated.  COMPARISON:  None.  FINDINGS: CT HEAD FINDINGS  Visualized paranasal sinuses and mastoids are clear. Visualized orbits and scalp soft tissues are within normal limits. No osseous abnormality identified.  Cerebral volume is normal. No midline shift, ventriculomegaly, mass effect, evidence of mass lesion, intracranial hemorrhage or evidence of cortically based acute infarction. Gray-white matter differentiation is within normal limits throughout the brain.  CT CERVICAL SPINE FINDINGS  Mild straightening of cervical lordosis. Visualized skull base is intact. No atlanto-occipital dissociation. Cervicothoracic junction alignment is within normal limits. Bilateral posterior element alignment is within normal limits. No cervical spine fracture. Visible upper thoracic levels appear intact.  Negative lung apices. Negative noncontrast paraspinal soft tissues. Visible pharyngeal and laryngeal soft tissue contours are within normal limits.  IMPRESSION: 1.  Normal noncontrast CT appearance of the brain. 2. No acute fracture or listhesis identified in the cervical spine. Ligamentous injury is not excluded.   Electronically Signed   By: Odessa Fleming M.D.   On: 03/02/2015 12:42     EKG Interpretation   Date/Time:  Friday Mar 02 2015 11:42:57 EDT Ventricular Rate:  57 PR Interval:  127 QRS Duration: 70 QT  Interval:  412 QTC Calculation: 401 R Axis:   54 Text Interpretation:  Sinus bradycardia ECG OTHERWISE WITHIN NORMAL LIMITS  Since last tracing rate slower Confirmed by Elener Custodio  MD, Kosha Jaquith ([id number]) on  03/02/2015 11:47:13 AM      MDM   Final diagnoses:  Trauma  Syncope, unspecified syncope type  UTI (lower urinary tract infection)    The patient has normal vital signs, mild bradycardia at 57 bpm but no signs of arrhythmia or Wolff-Parkinson-White syndrome. I suspect that her symptoms are related to the peanut exposure, she is not having no symptoms at this time, she will need imaging to evaluate for trauma to her head or cervical spine, check basic labs, rule out pregnancy or electrolytic abnormalities, react monitoring due to syncope, anticipate discharge if workup negative. No signs of acute allergic reaction that would require intervention at this time.  UTI present on labs - no other acute finding - abx and home - pt cleared after CT  showed no C spine injury, no neuro sx, C collar removed.  Meds given in ED:  Medications  oxyCODONE-acetaminophen (PERCOCET/ROXICET) 5-325 MG per tablet 2 tablet (not administered)  acetaminophen (TYLENOL) tablet 650 mg (650 mg Oral Given 03/02/15 1213)    New Prescriptions   CEPHALEXIN (KEFLEX) 500 MG CAPSULE    Take 1 capsule (500 mg total) by mouth 4 (four) times daily.   HYDROCODONE-ACETAMINOPHEN (NORCO/VICODIN) 5-325 MG PER TABLET    Take 2 tablets by mouth every 4 (four) hours as needed.   IBUPROFEN (ADVIL,MOTRIN) 800 MG TABLET    Take 1 tablet (800 mg total) by mouth 3 (three) times daily.      Eber HongBrian Hussein Macdougal, MD 03/02/15 1314

## 2015-03-02 NOTE — ED Notes (Signed)
Per [facility], pt was walking down the stairs and passed out, fell forward. Pt has no obvious injuries, c/o neck pain and headache. Pt states shes passed out a long time ago before. Pt also states she was exposed to peanuts earlier today, also c/o chest tightness.

## 2015-03-03 LAB — URINE CULTURE
COLONY COUNT: NO GROWTH
Culture: NO GROWTH

## 2015-03-05 ENCOUNTER — Other Ambulatory Visit: Payer: Self-pay | Admitting: Obstetrics and Gynecology

## 2015-03-05 DIAGNOSIS — N6320 Unspecified lump in the left breast, unspecified quadrant: Secondary | ICD-10-CM

## 2015-09-07 ENCOUNTER — Inpatient Hospital Stay (HOSPITAL_COMMUNITY)
Admission: AD | Admit: 2015-09-07 | Discharge: 2015-09-08 | Disposition: A | Discharge status: Home / Self Care | Payer: 59 | Source: Ambulatory Visit | Attending: Obstetrics and Gynecology | Admitting: Obstetrics and Gynecology

## 2015-09-07 ENCOUNTER — Encounter (HOSPITAL_COMMUNITY): Payer: Self-pay | Admitting: *Deleted

## 2015-09-07 DIAGNOSIS — R1032 Left lower quadrant pain: Secondary | ICD-10-CM | POA: Diagnosis not present

## 2015-09-07 DIAGNOSIS — A499 Bacterial infection, unspecified: Secondary | ICD-10-CM | POA: Diagnosis not present

## 2015-09-07 DIAGNOSIS — B9689 Other specified bacterial agents as the cause of diseases classified elsewhere: Secondary | ICD-10-CM

## 2015-09-07 DIAGNOSIS — N898 Other specified noninflammatory disorders of vagina: Secondary | ICD-10-CM | POA: Diagnosis present

## 2015-09-07 DIAGNOSIS — N923 Ovulation bleeding: Secondary | ICD-10-CM | POA: Diagnosis not present

## 2015-09-07 DIAGNOSIS — Z8619 Personal history of other infectious and parasitic diseases: Secondary | ICD-10-CM

## 2015-09-07 DIAGNOSIS — N760 Acute vaginitis: Secondary | ICD-10-CM | POA: Insufficient documentation

## 2015-09-07 LAB — URINALYSIS, ROUTINE W REFLEX MICROSCOPIC
Bilirubin Urine: NEGATIVE
GLUCOSE, UA: NEGATIVE mg/dL
Hgb urine dipstick: NEGATIVE
KETONES UR: NEGATIVE mg/dL
LEUKOCYTES UA: NEGATIVE
NITRITE: NEGATIVE
Protein, ur: NEGATIVE mg/dL
Specific Gravity, Urine: 1.025 (ref 1.005–1.030)
pH: 6 (ref 5.0–8.0)

## 2015-09-07 LAB — CBC
HCT: 34.2 % — ABNORMAL LOW (ref 36.0–46.0)
HEMOGLOBIN: 11.4 g/dL — AB (ref 12.0–15.0)
MCH: 31.3 pg (ref 26.0–34.0)
MCHC: 33.3 g/dL (ref 30.0–36.0)
MCV: 94 fL (ref 78.0–100.0)
Platelets: 215 10*3/uL (ref 150–400)
RBC: 3.64 MIL/uL — AB (ref 3.87–5.11)
RDW: 13.8 % (ref 11.5–15.5)
WBC: 8.3 10*3/uL (ref 4.0–10.5)

## 2015-09-07 LAB — POCT PREGNANCY, URINE: Preg Test, Ur: NEGATIVE

## 2015-09-07 NOTE — Progress Notes (Signed)
Ivonne AndrewV. Smith CNM with another pt so RN gave report to Dr Senaida Oresichardson regarding assessment and pt symptoms. Orders received.

## 2015-09-07 NOTE — MAU Provider Note (Signed)
Chief Complaint: Abdominal Cramping and Vaginal Bleeding   First Provider Initiated Contact with Patient 09/07/15 2348     SUBJECTIVE HPI: Tiffany Woodard is a 24 y.o. [id number] female who presents to [facility] reporting ongoing left lower quadrant pain, left low back pain, white vaginal discharge and vaginal burning since testing positive for chlamydia [facility] on 08/14/2015. States she took antibiotic as directed and has not had sexual intercourse since treatment. Concern a chlamydia may not have gone away and worried about other STDs. Also reports having an episode moderate bright red vaginal bleeding on 08/06/2015 not associated with her normal period. LMP 08/21/2015.  Has follow-up appointment scheduled with Dr. Ambrose Mantle on 07/21/2015 for chlamydia test of cure.  Location: Left lower quadrant and left low back Quality: Cramping, sometimes sharp Severity: 0/10 on pain scale now, but some episodes of pain 10/10 on pain scale. Duration: 4-5 weeks Context: Since exposure to chlamydia Course: Unchanged Timing: Intermittent Modifying factors: More frequent or noticeable when sitting down at work. No improvement since taking antibiotics for chlamydia. Hasn't tried anything for the pain. Associated signs and symptoms: Positive for low-grade fevers few weeks before diagnosis of chlamydia. Also positive for vaginal discharge, intermenstrual bleeding. Negative for GI complaints or urinary complaints.  Past Medical History  Diagnosis Date  . No pertinent past medical history   . Medical history non-contributory   . Depression   . Anxiety    OB History  Gravida Para Term Preterm AB SAB TAB Ectopic Multiple Living  # Outcome Date GA Lbr Len/2nd Weight Sex Delivery Anes PTL Lv  2 Term 02/23/13 [redacted]w[redacted]d 04:41 / 00:32 6 lb 3.5 oz (2.82 kg) M Vag-Vacuum EPI  Y     Comments: na  1 Term 2012 [redacted]w[redacted]d 02:00 6 lb (2.722 kg) F Vag-Spont EPI  Y     Past Surgical History   Procedure Laterality Date  . Wisdom tooth extraction    . Tonsillectomy     Social History   Social History  . Marital Status: Single    Spouse Name: N/A  . Number of Children: N/A  . Years of Education: N/A   Occupational History  . Not on file.   Social History Main Topics  . Smoking status: Never Smoker   . Smokeless tobacco: Never Used  . Alcohol Use: Yes     Comment: occ beer and wine  . Drug Use: No  . Sexual Activity: Not Currently    Birth Control/ Protection: None   Other Topics Concern  . Not on file   Social History Narrative   No current facility-administered medications on file prior to encounter.   Current Outpatient Prescriptions on File Prior to Encounter  Medication Sig Dispense Refill  . acetaminophen (TYLENOL) 500 MG tablet Take 500 mg by mouth every 6 (six) hours as needed for mild pain or headache.    . cephALEXin (KEFLEX) 500 MG capsule Take 1 capsule (500 mg total) by mouth 4 (four) times daily. 28 capsule 0  . clotrimazole (LOTRIMIN) 1 % cream Apply to affected area 2 times daily (Patient not taking: Reported on 03/02/2015) 15 g 0  . diphenhydrAMINE (SOMINEX) 25 MG tablet Take 25 mg by mouth at bedtime as needed for allergies or sleep.    Marland Kitchen griseofulvin (GRIFULVIN V) 500 MG tablet Take 1 tablet (500 mg total) by mouth daily. (Patient not taking: Reported on 03/02/2015) 14 tablet 0  .  HYDROcodone-acetaminophen (NORCO/VICODIN) 5-325 MG per tablet Take 2 tablets by mouth every 4 (four) hours as needed. 10 tablet 0  . naproxen (NAPROSYN) 375 MG tablet Take 1 tablet (375 mg total) by mouth 2 (two) times daily. (Patient not taking: Reported on 03/02/2015) 20 tablet 0   Allergies  Allergen Reactions  . Peanuts [Peanut Oil]     Hives     I have reviewed the past Medical Hx, Surgical Hx, Social Hx, Allergies and Medications.   Review of Systems  Constitutional: Positive for fever. Negative for chills.  Gastrointestinal: Positive for abdominal pain.  Negative for nausea, vomiting, diarrhea and constipation.  Genitourinary: Positive for vaginal bleeding, vaginal discharge, vaginal pain and menstrual problem. Negative for dysuria, urgency, frequency, hematuria, flank pain, genital sores and pelvic pain.  Musculoskeletal: Positive for back pain.  Skin: Negative for rash.    OBJECTIVE Patient Vitals for the past 24 hrs:  BP Temp Pulse Resp Height Weight  09/07/15 2208 137/87 mmHg 98.1 F (36.7 C) 70 20 5\' 3"  (1.6 m) 108 lb 9.6 oz (49.261 kg)   Constitutional: Well-developed, well-nourished female in no acute distress.  Cardiovascular: normal rate Respiratory: normal rate and effort.  GI: Abd soft, non-tender. No palpable masses. MS: Low back nontender. Normal range of motion. Neurologic: Alert and oriented x 4.  GU: Neg CVAT.  SPECULUM EXAM: NEFG, moderate amount of thin, white, malodorous discharge, no blood noted, cervix clean, but friable.  BIMANUAL: cervix closed; uterus normal size, no adnexal tenderness or masses. No CMT.  LAB RESULTS Results for orders placed or performed during the hospital encounter of 09/07/15 (from the past 24 hour(s))  Pregnancy, urine POC     Status: None   Collection Time: 09/07/15 10:27 PM  Result Value Ref Range   Preg Test, Ur NEGATIVE NEGATIVE  Urinalysis, Routine w reflex microscopic ([facility])     Status: None   Collection Time: 09/07/15 10:28 PM  Result Value Ref Range   Color, Urine YELLOW YELLOW   APPearance CLEAR CLEAR   Specific Gravity, Urine 1.025 1.005 - 1.030   pH 6.0 5.0 - 8.0   Glucose, UA NEGATIVE NEGATIVE mg/dL   Hgb urine dipstick NEGATIVE NEGATIVE   Bilirubin Urine NEGATIVE NEGATIVE   Ketones, ur NEGATIVE NEGATIVE mg/dL   Protein, ur NEGATIVE NEGATIVE mg/dL   Nitrite NEGATIVE NEGATIVE   Leukocytes, UA NEGATIVE NEGATIVE  CBC     Status: Abnormal   Collection Time: 09/07/15 11:26 PM  Result Value Ref Range   WBC 8.3 4.0 - 10.5 K/uL   RBC 3.64 (L) 3.87 - 5.11 MIL/uL    Hemoglobin 11.4 (L) 12.0 - 15.0 g/dL   HCT 40.934.2 (L) 81.136.0 - 91.446.0 %   MCV 94.0 78.0 - 100.0 fL   MCH 31.3 26.0 - 34.0 pg   MCHC 33.3 30.0 - 36.0 g/dL   RDW 78.213.8 95.611.5 - 21.315.5 %   Platelets 215 150 - 400 K/uL  Wet prep, genital     Status: Abnormal   Collection Time: 09/07/15 11:35 PM  Result Value Ref Range   Yeast Wet Prep HPF POC NONE SEEN NONE SEEN   Trich, Wet Prep NONE SEEN NONE SEEN   Clue Cells Wet Prep HPF POC PRESENT (A) NONE SEEN   WBC, Wet Prep HPF POC MODERATE (A) NONE SEEN   Sperm NONE SEEN     IMAGING No results found.  MAU COURSE UPT, CBC, UA, wet prep, GC/chlamydia cultures, HIV.   Discussed patient's ongoing  symptoms, concerns, exam and labs with Dr. Senaida Ores. Concerned that test of cure might be on the borderline of being too soon and may still be positive even if treatment has been effective. Discussed concerns with patient, but she would like to have repeat gonorrhea and Chlamydia testing done.  MDM 24 year old female diagnosed with Chlamydia 3-1/2 weeks ago presents with ongoing vaginal discharge and left lower quadrant/low back cramping. No evidence of PID or any other emergent cause of her low abdominal pain on today's exam.  ASSESSMENT 1. BV (bacterial vaginosis)   2. History of chlamydia infection   3. Intermenstrual bleeding     PLAN Discharge home in stable condition per consult with Dr. Senaida Ores.Marland Kitchen GC/chlamydia cultures, RPR, hep B surface antigen and HIV pending. Safe sex practices discussed.     Follow-up Information    Follow up with Bing Plume, MD On 09/20/2015.   Specialty:  Obstetrics and Gynecology   Why:  For follow-up appointment and test of cure. Ask about full STD testing.   Contact information:   [address] [place] [address] [phone]       Follow up with [facility] [place] [facility].   Why:  As needed in emergencies (fever greater than 100.4, severe abdominal pain)    Contact information:   [address] [id number] mc [place] [address] [phone]       Medication List    STOP taking these medications        cephALEXin 500 MG capsule  Commonly known as:  KEFLEX     clotrimazole 1 % cream  Commonly known as:  LOTRIMIN     diphenhydrAMINE 25 MG tablet  Commonly known as:  SOMINEX     griseofulvin 500 MG tablet  Commonly known as:  GRIFULVIN V     HYDROcodone-acetaminophen 5-325 MG tablet  Commonly known as:  NORCO/VICODIN     naproxen 375 MG tablet  Commonly known as:  NAPROSYN      TAKE these medications        acetaminophen 500 MG tablet  Commonly known as:  TYLENOL  Take 500 mg by mouth every 6 (six) hours as needed for mild pain or headache.     ibuprofen 800 MG tablet  Commonly known as:  ADVIL,MOTRIN  Take 1 tablet (800 mg total) by mouth 3 (three) times daily.     metroNIDAZOLE 500 MG tablet  Commonly known as:  FLAGYL  Take 1 tablet (500 mg total) by mouth 2 (two) times daily.       [place], CNM 09/08/2015  12:39 AM

## 2015-09-07 NOTE — MAU Note (Addendum)
About 3wks ago found out had Chlamydia. [facility] called in meds for Chlamydia. Still have some d/c which is white. Some vag burning. Want to have STD testing

## 2015-09-08 DIAGNOSIS — N760 Acute vaginitis: Secondary | ICD-10-CM | POA: Diagnosis not present

## 2015-09-08 DIAGNOSIS — A499 Bacterial infection, unspecified: Secondary | ICD-10-CM

## 2015-09-08 LAB — HEPATITIS B SURFACE ANTIGEN: Hepatitis B Surface Ag: NEGATIVE

## 2015-09-08 LAB — HIV ANTIBODY (ROUTINE TESTING W REFLEX): HIV Screen 4th Generation wRfx: NONREACTIVE

## 2015-09-08 LAB — WET PREP, GENITAL
Sperm: NONE SEEN
Trich, Wet Prep: NONE SEEN
YEAST WET PREP: NONE SEEN

## 2015-09-08 LAB — RPR: RPR: NONREACTIVE

## 2015-09-08 MED ORDER — METRONIDAZOLE 500 MG PO TABS: 500.0000 mg | ORAL_TABLET | Freq: Two times a day (BID) | ORAL | Status: DC

## 2015-09-08 MED ORDER — IBUPROFEN 800 MG PO TABS: 800.0000 mg | ORAL_TABLET | Freq: Three times a day (TID) | ORAL | Status: DC

## 2015-09-08 NOTE — Progress Notes (Signed)
Written and verbal d/c instructions given and understanding voiced. 

## 2015-09-08 NOTE — Progress Notes (Signed)
Ivonne AndrewV. Smith CNM in to discuss test results and d/c plan with pt

## 2015-09-08 NOTE — Discharge Instructions (Signed)

## 2015-09-10 LAB — GC/CHLAMYDIA PROBE AMP (~~LOC~~) NOT AT ARMC
Chlamydia: NEGATIVE
Neisseria Gonorrhea: NEGATIVE

## 2016-02-01 ENCOUNTER — Inpatient Hospital Stay (HOSPITAL_COMMUNITY): Payer: Self-pay

## 2016-02-01 ENCOUNTER — Encounter (HOSPITAL_COMMUNITY): Payer: Self-pay | Admitting: *Deleted

## 2016-02-01 ENCOUNTER — Inpatient Hospital Stay (HOSPITAL_COMMUNITY)
Admission: AD | Admit: 2016-02-01 | Discharge: 2016-02-01 | Disposition: A | Discharge status: Home / Self Care | Payer: Self-pay | Source: Ambulatory Visit | Attending: Obstetrics & Gynecology | Admitting: Obstetrics & Gynecology

## 2016-02-01 DIAGNOSIS — R102 Pelvic and perineal pain: Secondary | ICD-10-CM

## 2016-02-01 DIAGNOSIS — N939 Abnormal uterine and vaginal bleeding, unspecified: Secondary | ICD-10-CM | POA: Insufficient documentation

## 2016-02-01 DIAGNOSIS — N760 Acute vaginitis: Secondary | ICD-10-CM | POA: Insufficient documentation

## 2016-02-01 DIAGNOSIS — N940 Mittelschmerz: Secondary | ICD-10-CM | POA: Insufficient documentation

## 2016-02-01 DIAGNOSIS — Z91010 Allergy to peanuts: Secondary | ICD-10-CM | POA: Insufficient documentation

## 2016-02-01 DIAGNOSIS — A499 Bacterial infection, unspecified: Secondary | ICD-10-CM

## 2016-02-01 DIAGNOSIS — B9689 Other specified bacterial agents as the cause of diseases classified elsewhere: Secondary | ICD-10-CM

## 2016-02-01 LAB — URINALYSIS, ROUTINE W REFLEX MICROSCOPIC
BILIRUBIN URINE: NEGATIVE
GLUCOSE, UA: NEGATIVE mg/dL
KETONES UR: NEGATIVE mg/dL
Leukocytes, UA: NEGATIVE
Nitrite: NEGATIVE
PH: 6 (ref 5.0–8.0)
Protein, ur: NEGATIVE mg/dL
Specific Gravity, Urine: 1.02 (ref 1.005–1.030)

## 2016-02-01 LAB — CBC WITH DIFFERENTIAL/PLATELET
Basophils Absolute: 0 10*3/uL (ref 0.0–0.1)
Basophils Relative: 0 %
EOS ABS: 0.1 10*3/uL (ref 0.0–0.7)
EOS PCT: 1 %
HCT: 34.6 % — ABNORMAL LOW (ref 36.0–46.0)
HEMOGLOBIN: 11.4 g/dL — AB (ref 12.0–15.0)
LYMPHS ABS: 3.2 10*3/uL (ref 0.7–4.0)
Lymphocytes Relative: 39 %
MCH: 31.2 pg (ref 26.0–34.0)
MCHC: 32.9 g/dL (ref 30.0–36.0)
MCV: 94.8 fL (ref 78.0–100.0)
MONOS PCT: 4 %
Monocytes Absolute: 0.3 10*3/uL (ref 0.1–1.0)
Neutro Abs: 4.5 10*3/uL (ref 1.7–7.7)
Neutrophils Relative %: 56 %
PLATELETS: 228 10*3/uL (ref 150–400)
RBC: 3.65 MIL/uL — ABNORMAL LOW (ref 3.87–5.11)
RDW: 13.6 % (ref 11.5–15.5)
WBC: 8.1 10*3/uL (ref 4.0–10.5)

## 2016-02-01 LAB — WET PREP, GENITAL
SPERM: NONE SEEN
Trich, Wet Prep: NONE SEEN
WBC, Wet Prep HPF POC: NONE SEEN
YEAST WET PREP: NONE SEEN

## 2016-02-01 LAB — URINE MICROSCOPIC-ADD ON

## 2016-02-01 LAB — POCT PREGNANCY, URINE: Preg Test, Ur: NEGATIVE

## 2016-02-01 MED ORDER — METRONIDAZOLE 500 MG PO TABS: 500.0000 mg | ORAL_TABLET | Freq: Two times a day (BID) | ORAL | Status: DC

## 2016-02-01 MED ORDER — IBUPROFEN 800 MG PO TABS
800.0000 mg | ORAL_TABLET | Freq: Once | ORAL | Status: AC
  Administered 2016-02-01: 800 mg via ORAL
  Filled 2016-02-01: qty 1

## 2016-02-01 MED ORDER — KETOROLAC TROMETHAMINE 60 MG/2ML IM SOLN
30.0000 mg | Freq: Once | INTRAMUSCULAR | Status: DC
  Filled 2016-02-01: qty 2

## 2016-02-01 NOTE — MAU Provider Note (Signed)
History     CSN: [id number]  Arrival date and time: 02/01/16 1907   None     Chief Complaint  Patient presents with  . Vaginal Bleeding  . Abdominal Pain   HPI Tiffany Woodard is 25 y.o. [id number] presents with complaint of sharp left sided pain that began this afternoon while at work.  It was followed by bright red bleeding, brighter than her normal bleeding with menses. LMP 01/16/16..  Rates worst pain as 10/10 and at present 6/10.  Movement makes pain worse--moving boxes at work.  Resting helps.  Has not taken anything for pain.  Slight nausea without vomiting. Sexually active X 7 years, using condoms.  Hx of chlamydia in the fall of 2016, treated.  She is not concerned about STIs.  Past Medical History  Diagnosis Date  . No pertinent past medical history   . Medical history non-contributory   . Depression   . Anxiety     Past Surgical History  Procedure Laterality Date  . Wisdom tooth extraction    . Tonsillectomy      Family History  Problem Relation Age of Onset  . Other Neg Hx   . Heart disease Maternal Grandmother   . Hypertension Maternal Grandmother   . Diabetes Maternal Grandmother     Social History  Substance Use Topics  . Smoking status: Never Smoker   . Smokeless tobacco: Never Used  . Alcohol Use: Yes     Comment: occ beer and wine    Allergies:  Allergies  Allergen Reactions  . Peanuts [Peanut Oil]     Hives     Prescriptions prior to admission  Medication Sig Dispense Refill Last Dose  . acetaminophen (TYLENOL) 500 MG tablet Take 500 mg by mouth every 6 (six) hours as needed for mild pain or headache. Reported on 02/01/2016   Not Taking at Unknown time  . ibuprofen (ADVIL,MOTRIN) 800 MG tablet Take 1 tablet (800 mg total) by mouth 3 (three) times daily. (Patient not taking: Reported on 02/01/2016) 30 tablet 1   . metroNIDAZOLE (FLAGYL) 500 MG tablet Take 1 tablet (500 mg total) by mouth 2 (two) times daily. (Patient not taking: Reported on  02/01/2016) 14 tablet 0     Review of Systems  Constitutional: Negative for fever and chills.  Respiratory: Negative for cough.   Cardiovascular: Negative for chest pain.  Gastrointestinal: Positive for nausea and abdominal pain (lower left pain). Negative for vomiting.  Genitourinary: Negative for dysuria, urgency, frequency and hematuria.       + for abnormal vaginal bleeding.   Neurological: Negative for headaches.   Physical Exam   Blood pressure 123/84, pulse 54, temperature 98.3 F (36.8 C), resp. rate 18, height  (1.626 m), weight 116 lb 6.4 oz (52.799 kg), last menstrual period 01/16/2016.  Physical Exam  Nursing note and vitals reviewed. Constitutional: She is oriented to person, place, and time. She appears well-developed and well-nourished. No distress.  HENT:  Head: Normocephalic.  Neck: Normal range of motion.  Cardiovascular: Normal rate.   Respiratory: Effort normal.  GI: Soft. She exhibits no distension and no mass. There is no tenderness. There is no rebound and no guarding.  Genitourinary: There is no tenderness, lesion or injury on the right labia. There is no tenderness, lesion or injury on the left labia. Uterus is not enlarged and not tender. Cervix exhibits no motion tenderness, no discharge and no friability. Right adnexum displays no mass, no tenderness and no fullness.  Left adnexum displays tenderness and fullness. Left adnexum displays no mass. There is bleeding (small amount of bright red bleeding with 1 mod size clot.  ) in the vagina. No erythema or tenderness in the vagina.  Neurological: She is alert and oriented to person, place, and time.  Skin: Skin is warm and dry.  Psychiatric: She has a normal mood and affect. Her behavior is normal. Thought content normal.   Results for orders placed or performed during the hospital encounter of 02/01/16 (from the past 24 hour(s))  Urinalysis, Routine w reflex microscopic (not [facility])     Status: Abnormal    Collection Time: 02/01/16  7:23 PM  Result Value Ref Range   Color, Urine YELLOW YELLOW   APPearance CLEAR CLEAR   Specific Gravity, Urine 1.020 1.005 - 1.030   pH 6.0 5.0 - 8.0   Glucose, UA NEGATIVE NEGATIVE mg/dL   Hgb urine dipstick SMALL (A) NEGATIVE   Bilirubin Urine NEGATIVE NEGATIVE   Ketones, ur NEGATIVE NEGATIVE mg/dL   Protein, ur NEGATIVE NEGATIVE mg/dL   Nitrite NEGATIVE NEGATIVE   Leukocytes, UA NEGATIVE NEGATIVE  Urine microscopic-add on     Status: Abnormal   Collection Time: 02/01/16  7:23 PM  Result Value Ref Range   Squamous Epithelial / LPF 0-5 (A) NONE SEEN   WBC, UA 0-5 0 - 5 WBC/hpf   RBC / HPF 0-5 0 - 5 RBC/hpf   Bacteria, UA FEW (A) NONE SEEN   Urine-Other MUCOUS PRESENT   Pregnancy, urine POC     Status: None   Collection Time: 02/01/16  7:41 PM  Result Value Ref Range   Preg Test, Ur NEGATIVE NEGATIVE  Wet prep, genital     Status: Abnormal   Collection Time: 02/01/16  7:53 PM  Result Value Ref Range   Yeast Wet Prep HPF POC NONE SEEN NONE SEEN   Trich, Wet Prep NONE SEEN NONE SEEN   Clue Cells Wet Prep HPF POC PRESENT (A) NONE SEEN   WBC, Wet Prep HPF POC NONE SEEN NONE SEEN   Sperm NONE SEEN   CBC with Differential/Platelet     Status: Abnormal (Preliminary result)   Collection Time: 02/01/16  8:10 PM  Result Value Ref Range   WBC 8.1 4.0 - 10.5 K/uL   RBC 3.65 (L) 3.87 - 5.11 MIL/uL   Hemoglobin 11.4 (L) 12.0 - 15.0 g/dL   HCT 54.034.6 (L) 98.136.0 - 19.146.0 %   MCV 94.8 78.0 - 100.0 fL   MCH 31.2 26.0 - 34.0 pg   MCHC 32.9 30.0 - 36.0 g/dL   RDW 47.813.6 29.511.5 - 62.115.5 %   Platelets 228 150 - 400 K/uL   Neutrophils Relative % 56 %   Neutro Abs 4.5 1.7 - 7.7 K/uL   Lymphocytes Relative 39 %   Lymphs Abs 3.2 0.7 - 4.0 K/uL   Monocytes Relative 4 %   Monocytes Absolute 0.3 0.1 - 1.0 K/uL   Eosinophils Relative 1 %   Eosinophils Absolute 0.1 0.0 - 0.7 K/uL   Basophils Relative 0 %   Basophils Absolute 0.0 0.0 - 0.1 K/uL   Other PENDING %   [place]  Transvaginal Non-ob  02/01/2016  CLINICAL DATA:  25 year old female with history of sharp left-sided pelvic pain and bright red vaginal bleeding today. EXAM: TRANSABDOMINAL AND TRANSVAGINAL ULTRASOUND OF PELVIS TECHNIQUE: Both transabdominal and transvaginal ultrasound examinations of the pelvis were performed. Transabdominal technique was performed for global imaging of the pelvis including uterus,  ovaries, adnexal regions, and pelvic cul-de-sac. It was necessary to proceed with endovaginal exam following the transabdominal exam to visualize the endometrium. COMPARISON:  OB ultrasound 07/07/2012. FINDINGS: Uterus Measurements: 9.5 x 5.5 x 5.9 cm. No fibroids or other mass visualized. Endometrium Thickness: 4 mm.  No focal abnormality visualized. Right ovary Measurements: 1.9 x 2.2 x 2.9 cm. Normal appearance/no adnexal mass. Left ovary Measurements: 3.3 x 2.0 x 2.1 cm. Normal appearance/no adnexal mass. Other findings No abnormal free fluid. IMPRESSION: 1. Normal appearance of the uterus and ovaries. 2. No acute findings. Electronically Signed   By: Trudie Reed M.D.   On: 02/01/2016 20:43   US Pelvis Complete  02/01/2016  CLINICAL DATA:  25 year old female with history of sharp left-sided pelvic pain and bright red vaginal bleeding today. EXAM: TRANSABDOMINAL AND TRANSVAGINAL ULTRASOUND OF PELVIS TECHNIQUE: Both transabdominal and transvaginal ultrasound examinations of the pelvis were performed. Transabdominal technique was performed for global imaging of the pelvis including uterus, ovaries, adnexal regions, and pelvic cul-de-sac. It was necessary to proceed with endovaginal exam following the transabdominal exam to visualize the endometrium. COMPARISON:  OB ultrasound 07/07/2012. FINDINGS: Uterus Measurements: 9.5 x 5.5 x 5.9 cm. No fibroids or other mass visualized. Endometrium Thickness: 4 mm.  No focal abnormality visualized. Right ovary Measurements: 1.9 x 2.2 x 2.9 cm. Normal appearance/no adnexal  mass. Left ovary Measurements: 3.3 x 2.0 x 2.1 cm. Normal appearance/no adnexal mass. Other findings No abnormal free fluid. IMPRESSION: 1. Normal appearance of the uterus and ovaries. 2. No acute findings. Electronically Signed   By: Trudie Reed M.D.   On: 02/01/2016 20:43    MAU Course  Procedures GC/CHL and HIV results pending  MDM MSE Exam Labs U/S--neg for findings Medication: Toradol  IM given in MAU--patient declined when RN went in to give injection.  She states she will take something po.  Advil  po given in MAU Assessment and Plan  A:  Left lower pelvic pain      Abnormal uterine bleeding     Mittelschmerz discomfort      Bacterial Vaginosis  P: Rx for Flagyl to pharmacy     May repeat Ibuprofen after 8 hrs if pain continues     Discussed U/S and lab findings             KEY,EVE M 02/01/2016, 8:35 PM

## 2016-02-01 NOTE — Discharge Instructions (Signed)
Pelvic Pain, Female °Pelvic pain is pain felt below the belly button and between your hips. It can be caused by many different things. It is important to get help right away. This is especially true for severe, sharp, or unusual pain that comes on suddenly.  °HOME CARE °· Only take medicine as told by your doctor. °· Rest as told by your doctor. °· Eat a healthy diet, such as fruits, vegetables, and lean meats. °· Drink enough fluids to keep your pee (urine) clear or pale yellow, or as told. °· Avoid sex (intercourse) if it causes pain. °· Apply warm or cold packs to your lower belly (abdomen). Use the type of pack that helps the pain. °· Avoid situations that cause you stress. °· Keep a journal to track your pain. Write down: °¨ When the pain started. °¨ Where it is located. °¨ If there are things that seem to be related to the pain, such as food or your period. °· Follow up with your doctor as told. °GET HELP RIGHT AWAY IF:  °· You have heavy bleeding from the vagina. °· You have more pelvic pain. °· You feel lightheaded or pass out (faint). °· You have chills. °· You have pain when you pee or have blood in your pee. °· You cannot stop having watery poop (diarrhea). °· You cannot stop throwing up (vomiting). °· You have a fever or lasting symptoms for more than 3 days. °· You have a fever and your symptoms suddenly get worse. °· You are being physically or sexually abused. °· Your medicine does not help your pain. °· You have fluid (discharge) coming from your vagina that is not normal. °MAKE SURE YOU: °· Understand these instructions. °· Will watch your condition. °· Will get help if you are not doing well or get worse. °  °This information is not intended to replace advice given to you by your health care provider. Make sure you discuss any questions you have with your health care provider. °  °Document Released: 03/24/2008 Document Revised: 10/27/2014 Document Reviewed: 01/26/2012 °Elsevier Interactive Patient  Education ©2016 [employer] °Bacterial Vaginosis °Bacterial vaginosis is an infection of the vagina. It happens when too many germs (bacteria) grow in the vagina. Having this infection puts you at risk for getting other infections from sex. Treating this infection can help lower your risk for other infections, such as:  °· Chlamydia. °· Gonorrhea. °· HIV. °· Herpes. °HOME CARE °· Take your medicine as told by your doctor. °· Finish your medicine even if you start to feel better. °· Tell your sex partner that you have an infection. They should see their doctor for treatment. °· During treatment: °· Avoid sex or use condoms correctly. °· Do not douche. °· Do not drink alcohol unless your doctor tells you it is ok. °· Do not breastfeed unless your doctor tells you it is ok. °GET HELP IF: °· You are not getting better after 3 days of treatment. °· You have more grey fluid (discharge) coming from your vagina than before. °· You have more pain than before. °· You have a fever. °MAKE SURE YOU:  °· Understand these instructions. °· Will watch your condition. °· Will get help right away if you are not doing well or get worse. °  °This information is not intended to replace advice given to you by your health care provider. Make sure you discuss any questions you have with your health care provider. °  °Document Released: 07/15/2008   Document Revised: 10/27/2014 Document Reviewed: 05/18/2013 °Elsevier Interactive Patient Education ©2016 [employer] ° °

## 2016-02-01 NOTE — MAU Note (Signed)
My period just went off end of march. Had sharp pain on my L side and then started having really bright red bleeding. LMP 01/16/16

## 2016-02-02 LAB — HIV ANTIBODY (ROUTINE TESTING W REFLEX): HIV Screen 4th Generation wRfx: NONREACTIVE

## 2016-02-04 LAB — GC/CHLAMYDIA PROBE AMP (~~LOC~~) NOT AT ARMC
Chlamydia: NEGATIVE
NEISSERIA GONORRHEA: NEGATIVE

## 2016-05-06 ENCOUNTER — Inpatient Hospital Stay (HOSPITAL_COMMUNITY)
Admission: AD | Admit: 2016-05-06 | Discharge: 2016-05-07 | Disposition: A | Discharge status: Home / Self Care | Payer: BLUE CROSS/BLUE SHIELD | Source: Ambulatory Visit | Attending: Obstetrics & Gynecology | Admitting: Obstetrics & Gynecology

## 2016-05-06 DIAGNOSIS — A499 Bacterial infection, unspecified: Secondary | ICD-10-CM | POA: Diagnosis not present

## 2016-05-06 DIAGNOSIS — F329 Major depressive disorder, single episode, unspecified: Secondary | ICD-10-CM | POA: Diagnosis not present

## 2016-05-06 DIAGNOSIS — A084 Viral intestinal infection, unspecified: Secondary | ICD-10-CM | POA: Insufficient documentation

## 2016-05-06 DIAGNOSIS — Z91010 Allergy to peanuts: Secondary | ICD-10-CM | POA: Insufficient documentation

## 2016-05-06 DIAGNOSIS — R1032 Left lower quadrant pain: Secondary | ICD-10-CM | POA: Diagnosis present

## 2016-05-06 DIAGNOSIS — N760 Acute vaginitis: Secondary | ICD-10-CM | POA: Insufficient documentation

## 2016-05-06 DIAGNOSIS — F419 Anxiety disorder, unspecified: Secondary | ICD-10-CM | POA: Insufficient documentation

## 2016-05-06 DIAGNOSIS — B9689 Other specified bacterial agents as the cause of diseases classified elsewhere: Secondary | ICD-10-CM

## 2016-05-06 LAB — POCT PREGNANCY, URINE: Preg Test, Ur: NEGATIVE

## 2016-05-06 NOTE — MAU Note (Signed)
Pt c/o LLQ pain that started about a week and vomiting that started tonight. Pt has [facility] that [facility]. Has a thick, white discharge-denies odor or itching. Denies urinary s/s.

## 2016-05-06 NOTE — MAU Provider Note (Signed)
History     CSN: [id number]  Arrival date and time: 05/06/16 2333   First Provider Initiated Contact with Patient 05/06/16 2357      Chief Complaint  Patient presents with  . Abdominal Pain  . Vomiting   HPI [name] is a 25 y.o. [phone] who presents to MAU today with complaint of LLQ abdominal cramping x 1 weeks, but worse tonight. She states new onset N/V tonight as well. She denies diarrhea, constipation, fever, vaginal bleeding or UTI symptoms. She has noted a small amount of thick, white discharge without odor, irritation or itching. She is requesting a work note.    OB History    Gravida Para Term Preterm AB TAB SAB Ectopic Multiple Living   2 2 2       2       Past Medical History  Diagnosis Date  . No pertinent past medical history   . Medical history non-contributory   . Depression   . Anxiety     Past Surgical History  Procedure Laterality Date  . Wisdom tooth extraction    . Tonsillectomy      Family History  Problem Relation Age of Onset  . Other Neg Hx   . Heart disease Maternal Grandmother   . Hypertension Maternal Grandmother   . Diabetes Maternal Grandmother     Social History  Substance Use Topics  . Smoking status: Never Smoker   . Smokeless tobacco: Never Used  . Alcohol Use: Yes     Comment: occ beer and wine    Allergies:  Allergies  Allergen Reactions  . Peanuts [Peanut Oil]     Hives     Prescriptions prior to admission  Medication Sig Dispense Refill Last Dose  . acetaminophen (TYLENOL) 500 MG tablet Take 500 mg by mouth every 6 (six) hours as needed for mild pain or headache. Reported on 02/01/2016   Not Taking at Unknown time  . [DISCONTINUED] metroNIDAZOLE (FLAGYL) 500 MG tablet Take 1 tablet (500 mg total) by mouth 2 (two) times daily. 14 tablet 0     Review of Systems  Constitutional: Negative for fever and malaise/fatigue.  Gastrointestinal: Positive for nausea, vomiting and abdominal pain. Negative for  diarrhea and constipation.  Genitourinary: Negative for dysuria, urgency and frequency.       + discharge Neg- vaginal bleeding   Physical Exam   Blood pressure 119/74, pulse 56, temperature 98 F (36.7 C), temperature source Oral, resp. rate 20, height 5\' 2"  (1.575 m), weight 112 lb (50.803 kg), SpO2 100 %.  Physical Exam  Nursing note and vitals reviewed. Constitutional: She is oriented to person, place, and time. She appears well-developed and well-nourished. No distress.  HENT:  Head: Normocephalic and atraumatic.  Cardiovascular: Normal rate.   Respiratory: Effort normal.  GI: Soft. She exhibits no distension and no mass. There is no tenderness. There is no rebound and no guarding.  Genitourinary: Uterus is not enlarged and not tender. Cervix exhibits no motion tenderness, no discharge and no friability. Right adnexum displays no mass and no tenderness. Left adnexum displays no mass and no tenderness. No bleeding in the vagina. Vaginal discharge (scant thin, white discharge noted without odor) found.  Neurological: She is alert and oriented to person, place, and time.  Skin: Skin is warm and dry. No erythema.  Psychiatric: She has a normal mood and affect.    Results for orders placed or performed during the hospital encounter of 05/06/16 (from the past  24 hour(s))  Urinalysis, Routine w reflex microscopic (not at [facility])     Status: Abnormal   Collection Time: 05/06/16 11:48 PM  Result Value Ref Range   Color, Urine YELLOW YELLOW   APPearance CLEAR CLEAR   Specific Gravity, Urine >1.030 (H) 1.005 - 1.030   pH 6.0 5.0 - 8.0   Glucose, UA NEGATIVE NEGATIVE mg/dL   Hgb urine dipstick NEGATIVE NEGATIVE   Bilirubin Urine NEGATIVE NEGATIVE   Ketones, ur NEGATIVE NEGATIVE mg/dL   Protein, ur NEGATIVE NEGATIVE mg/dL   Nitrite NEGATIVE NEGATIVE   Leukocytes, UA NEGATIVE NEGATIVE  Pregnancy, urine POC     Status: None   Collection Time: 05/06/16 11:55 PM  Result Value Ref Range    Preg Test, Ur NEGATIVE NEGATIVE  Wet prep, genital     Status: Abnormal   Collection Time: 05/07/16 12:02 AM  Result Value Ref Range   Yeast Wet Prep HPF POC NONE SEEN NONE SEEN   Trich, Wet Prep NONE SEEN NONE SEEN   Clue Cells Wet Prep HPF POC PRESENT (A) NONE SEEN   WBC, Wet Prep HPF POC MANY (A) NONE SEEN   Sperm NONE SEEN     MAU Course  Procedures None  MDM UPT - negative UA, wet prep, GC/Chlamydia, HIV and RPR today   Assessment and Plan  A: Viral gastroenteritis Bacterial vaginosis  P: Discharge home Rx for Zofran and Flagyl given to patient  Warning signs for worsening condition discussed Diet for N/V included in AVS, increased PO hydration advised Patient advised to follow-up with PCP of choice Patient may return to MAU as needed or if her condition were to change or worsen   Marny Lowenstein, PA-C  05/07/2016, 12:34 AM

## 2016-05-07 DIAGNOSIS — N760 Acute vaginitis: Secondary | ICD-10-CM | POA: Diagnosis not present

## 2016-05-07 DIAGNOSIS — A499 Bacterial infection, unspecified: Secondary | ICD-10-CM | POA: Diagnosis not present

## 2016-05-07 DIAGNOSIS — A084 Viral intestinal infection, unspecified: Secondary | ICD-10-CM | POA: Diagnosis not present

## 2016-05-07 LAB — URINALYSIS, ROUTINE W REFLEX MICROSCOPIC
BILIRUBIN URINE: NEGATIVE
GLUCOSE, UA: NEGATIVE mg/dL
HGB URINE DIPSTICK: NEGATIVE
KETONES UR: NEGATIVE mg/dL
Leukocytes, UA: NEGATIVE
Nitrite: NEGATIVE
PH: 6 (ref 5.0–8.0)
Protein, ur: NEGATIVE mg/dL

## 2016-05-07 LAB — WET PREP, GENITAL
SPERM: NONE SEEN
TRICH WET PREP: NONE SEEN
YEAST WET PREP: NONE SEEN

## 2016-05-07 LAB — GC/CHLAMYDIA PROBE AMP (~~LOC~~) NOT AT ARMC
Chlamydia: NEGATIVE
Neisseria Gonorrhea: NEGATIVE

## 2016-05-07 MED ORDER — METRONIDAZOLE 500 MG PO TABS: 500.0000 mg | ORAL_TABLET | Freq: Two times a day (BID) | ORAL | Status: DC

## 2016-05-07 MED ORDER — ONDANSETRON 8 MG PO TBDP: 8.0000 mg | ORAL_TABLET | Freq: Once | ORAL | Status: DC

## 2016-05-07 MED ORDER — ONDANSETRON HCL 4 MG PO TABS
4.0000 mg | ORAL_TABLET | Freq: Four times a day (QID) | ORAL | Status: DC
Start: 2016-05-07 — End: 2016-10-21

## 2016-05-07 NOTE — Discharge Instructions (Signed)
Bacterial Vaginosis Bacterial vaginosis is an infection of the vagina. It happens when too many germs (bacteria) grow in the vagina. Having this infection puts you at risk for getting other infections from sex. Treating this infection can help lower your risk for other infections, such as:   Chlamydia.  Gonorrhea.  HIV.  Herpes. HOME CARE  Take your medicine as told by your doctor.  Finish your medicine even if you start to feel better.  Tell your sex partner that you have an infection. They should see their doctor for treatment.  During treatment:  Avoid sex or use condoms correctly.  Do not douche.  Do not drink alcohol unless your doctor tells you it is ok.  Do not breastfeed unless your doctor tells you it is ok. GET HELP IF:  You are not getting better after 3 days of treatment.  You have more grey fluid (discharge) coming from your vagina than before.  You have more pain than before.  You have a fever. MAKE SURE YOU:   Understand these instructions.  Will watch your condition.  Will get help right away if you are not doing well or get worse.   This information is not intended to replace advice given to you by your health care provider. Make sure you discuss any questions you have with your health care provider.   Document Released: 07/15/2008 Document Revised: 10/27/2014 Document Reviewed: 05/18/2013 Elsevier Interactive Patient Education 2016 [employer]  Food Choices to Help Relieve Diarrhea, Adult When you have diarrhea, the foods you eat and your eating habits are very important. Choosing the right foods and drinks can help relieve diarrhea. Also, because diarrhea can last up to 7 days, you need to replace lost fluids and electrolytes (such as sodium, potassium, and chloride) in order to help prevent dehydration.  WHAT GENERAL GUIDELINES DO I NEED TO FOLLOW?  Slowly drink 1 cup (8 oz) of fluid for each episode of diarrhea. If you are getting enough  fluid, your urine will be clear or pale yellow.  Eat starchy foods. Some good choices include white rice, white toast, pasta, low-fiber cereal, baked potatoes (without the skin), saltine crackers, and bagels.  Avoid large servings of any cooked vegetables.  Limit fruit to two servings per day. A serving is  cup or 1 small piece.  Choose foods with less than 2 g of fiber per serving.  Limit fats to less than 8 tsp (38 g) per day.  Avoid fried foods.  Eat foods that have probiotics in them. Probiotics can be found in certain dairy products.  Avoid foods and beverages that may increase the speed at which food moves through the stomach and intestines (gastrointestinal tract). Things to avoid include:  High-fiber foods, such as dried fruit, raw fruits and vegetables, nuts, seeds, and whole grain foods.  Spicy foods and high-fat foods.  Foods and beverages sweetened with high-fructose corn syrup, honey, or sugar alcohols such as xylitol, sorbitol, and mannitol. WHAT FOODS ARE RECOMMENDED? Grains White rice. White, [place], or pita breads (fresh or toasted), including plain rolls, buns, or bagels. White pasta. Saltine, soda, or graham crackers. Pretzels. Low-fiber cereal. Cooked cereals made with water (such as cornmeal, farina, or cream cereals). Plain muffins. Matzo. Melba toast. Zwieback.  Vegetables Potatoes (without the skin). Strained tomato and vegetable juices. Most well-cooked and canned vegetables without seeds. Tender lettuce. Fruits Cooked or canned applesauce, apricots, cherries, fruit cocktail, grapefruit, peaches, pears, or plums. Fresh bananas, apples without skin, cherries, grapes,  cantaloupe, grapefruit, peaches, oranges, or plums.  Meat and Other Protein Products Baked or boiled chicken. Eggs. Tofu. Fish. Seafood. Smooth peanut butter. Ground or well-cooked tender beef, ham, veal, lamb, pork, or poultry.  Dairy Plain yogurt, kefir, and unsweetened liquid yogurt.  Lactose-free milk, buttermilk, or soy milk. Plain hard cheese. Beverages Sport drinks. Clear broths. Diluted fruit juices (except prune). Regular, caffeine-free sodas such as ginger ale. Water. Decaffeinated teas. Oral rehydration solutions. Sugar-free beverages not sweetened with sugar alcohols. Other Bouillon, broth, or soups made from recommended foods.  The items listed above may not be a complete list of recommended foods or beverages. Contact your dietitian for more options. WHAT FOODS ARE NOT RECOMMENDED? Grains Whole grain, whole wheat, bran, or rye breads, rolls, pastas, crackers, and cereals. Wild or brown rice. Cereals that contain more than 2 g of fiber per serving. Corn tortillas or taco shells. Cooked or dry oatmeal. Granola. Popcorn. Vegetables Raw vegetables. Cabbage, broccoli, Brussels sprouts, artichokes, baked beans, beet greens, corn, kale, legumes, peas, sweet potatoes, and yams. Potato skins. Cooked spinach and cabbage. Fruits Dried fruit, including raisins and dates. Raw fruits. Stewed or dried prunes. Fresh apples with skin, apricots, mangoes, pears, raspberries, and strawberries.  Meat and Other Protein Products Chunky peanut butter. Nuts and seeds. Beans and lentils. Tomasa Blase.  Dairy High-fat cheeses. Milk, chocolate milk, and beverages made with milk, such as milk shakes. Cream. Ice cream. Sweets and Desserts Sweet rolls, doughnuts, and sweet breads. Pancakes and waffles. Fats and Oils Butter. Cream sauces. Margarine. Salad oils. Plain salad dressings. Olives. Avocados.  Beverages Caffeinated beverages (such as coffee, tea, soda, or energy drinks). Alcoholic beverages. Fruit juices with pulp. Prune juice. Soft drinks sweetened with high-fructose corn syrup or sugar alcohols. Other Coconut. Hot sauce. Chili powder. Mayonnaise. Gravy. Cream-based or milk-based soups.  The items listed above may not be a complete list of foods and beverages to avoid. Contact your  dietitian for more information. WHAT SHOULD I DO IF I BECOME DEHYDRATED? Diarrhea can sometimes lead to dehydration. Signs of dehydration include dark urine and dry mouth and skin. If you think you are dehydrated, you should rehydrate with an oral rehydration solution. These solutions can be purchased at pharmacies, retail stores, or online.  Drink -1 cup (120-240 mL) of oral rehydration solution each time you have an episode of diarrhea. If drinking this amount makes your diarrhea worse, try drinking smaller amounts more often. For example, drink 1-3 tsp (5-15 mL) every 5-10 minutes.  A general rule for staying hydrated is to drink 1-2 L of fluid per day. Talk to your health care provider about the specific amount you should be drinking each day. Drink enough fluids to keep your urine clear or pale yellow.   This information is not intended to replace advice given to you by your health care provider. Make sure you discuss any questions you have with your health care provider.   Document Released: 12/27/2003 Document Revised: 10/27/2014 Document Reviewed: 08/29/2013 Elsevier Interactive Patient Education [employer]

## 2016-06-11 ENCOUNTER — Inpatient Hospital Stay (HOSPITAL_COMMUNITY)
Admission: AD | Admit: 2016-06-11 | Discharge: 2016-06-12 | Disposition: A | Discharge status: Home / Self Care | Payer: BLUE CROSS/BLUE SHIELD | Source: Ambulatory Visit | Attending: Obstetrics & Gynecology | Admitting: Obstetrics & Gynecology

## 2016-06-11 DIAGNOSIS — B9689 Other specified bacterial agents as the cause of diseases classified elsewhere: Secondary | ICD-10-CM

## 2016-06-11 DIAGNOSIS — F419 Anxiety disorder, unspecified: Secondary | ICD-10-CM | POA: Diagnosis not present

## 2016-06-11 DIAGNOSIS — F329 Major depressive disorder, single episode, unspecified: Secondary | ICD-10-CM | POA: Diagnosis not present

## 2016-06-11 DIAGNOSIS — R109 Unspecified abdominal pain: Secondary | ICD-10-CM | POA: Diagnosis present

## 2016-06-11 DIAGNOSIS — N760 Acute vaginitis: Secondary | ICD-10-CM | POA: Insufficient documentation

## 2016-06-11 DIAGNOSIS — A499 Bacterial infection, unspecified: Secondary | ICD-10-CM | POA: Diagnosis not present

## 2016-06-11 LAB — URINALYSIS, ROUTINE W REFLEX MICROSCOPIC
Bilirubin Urine: NEGATIVE
Glucose, UA: NEGATIVE mg/dL
Ketones, ur: NEGATIVE mg/dL
Leukocytes, UA: NEGATIVE
NITRITE: NEGATIVE
Protein, ur: NEGATIVE mg/dL
pH: 6 (ref 5.0–8.0)

## 2016-06-11 LAB — URINE MICROSCOPIC-ADD ON

## 2016-06-11 LAB — POCT PREGNANCY, URINE: PREG TEST UR: NEGATIVE

## 2016-06-11 NOTE — MAU Note (Signed)
Pt reports lower abd pain x 1 1/2 weeks , nausea, vaginal discharge, burning with urination.

## 2016-06-12 ENCOUNTER — Encounter (HOSPITAL_COMMUNITY): Payer: Self-pay | Admitting: *Deleted

## 2016-06-12 DIAGNOSIS — A499 Bacterial infection, unspecified: Secondary | ICD-10-CM

## 2016-06-12 DIAGNOSIS — N760 Acute vaginitis: Secondary | ICD-10-CM | POA: Diagnosis not present

## 2016-06-12 LAB — WET PREP, GENITAL
SPERM: NONE SEEN
TRICH WET PREP: NONE SEEN
YEAST WET PREP: NONE SEEN

## 2016-06-12 MED ORDER — METRONIDAZOLE 500 MG PO TABS: 500.0000 mg | ORAL_TABLET | Freq: Two times a day (BID) | ORAL | 0 refills | Status: AC

## 2016-06-12 MED ORDER — IBUPROFEN 600 MG PO TABS
600.0000 mg | ORAL_TABLET | Freq: Once | ORAL | Status: AC
  Administered 2016-06-12: 600 mg via ORAL
  Filled 2016-06-12: qty 1

## 2016-06-12 MED ORDER — IBUPROFEN 600 MG PO TABS: 600.0000 mg | ORAL_TABLET | Freq: Four times a day (QID) | ORAL | 0 refills | Status: DC | PRN

## 2016-06-12 NOTE — MAU Provider Note (Signed)
Chief Complaint: No chief complaint on file.    SUBJECTIVE HPI: Tiffany Woodard is a 25 y.o. G2P2002 at Unknown who presents to [facility] reporting sharp abdominal pain.  Patient is reporting left-sided pelvic sharp abdominal pain, started 1 week ago, occurring daily. Left-sided, sharp pain, constant for a few hours, then goes away then comes back. Has only tried Tylenol, but does not work for pain. + dysuria, no frequency or incomplete emptying. Patient has been seen in the MAU for this problem 05/06/16, 02/01/16, 09/07/15. In the past, she was treated for BV every time, and the pain goes away after treatment. Notices most time had associated VB. Currently has the nexplanon, inserted 02/2016.   Denies N/V. Denies HAs. Normal BMs, daily, soft. Denies bloody stools.     Past Medical History:  Diagnosis Date  . Anxiety   . Depression   . Medical history non-contributory   . No pertinent past medical history    OB History  Gravida Para Term Preterm AB Living  2 2 2     2   SAB TAB Ectopic Multiple Live Births          2    # Outcome Date GA Lbr Len/2nd Weight Sex Delivery Anes PTL Lv  2 Term 02/23/13 [redacted]w[redacted]d 04:41 / 00:32 6 lb 3.5 oz (2.82 kg) M Vag-Vacuum EPI  LIV     Birth Comments: na  1 Term 2012 [redacted]w[redacted]d 02:00 6 lb (2.722 kg) F Vag-Spont EPI  LIV     Past Surgical History:  Procedure Laterality Date  . TONSILLECTOMY    . WISDOM TOOTH EXTRACTION     Social History   Social History  . Marital status: Single    Spouse name: N/A  . Number of children: N/A  . Years of education: N/A   Occupational History  . Not on file.   Social History Main Topics  . Smoking status: Never Smoker  . Smokeless tobacco: Never Used  . Alcohol use Yes     Comment: occ beer and wine  . Drug use: No  . Sexual activity: Not Currently    Birth control/ protection: Implant   Other Topics Concern  . Not on file   Social History Narrative  . No narrative on file   No current  facility-administered medications on file prior to encounter.    Current Outpatient Prescriptions on File Prior to Encounter  Medication Sig Dispense Refill  . acetaminophen (TYLENOL) 500 MG tablet Take 500 mg by mouth every 6 (six) hours as needed for mild pain or headache. Reported on 02/01/2016    . metroNIDAZOLE (FLAGYL) 500 MG tablet Take 1 tablet (500 mg total) by mouth 2 (two) times daily. 14 tablet 0  . ondansetron (ZOFRAN) 4 MG tablet Take 1 tablet (4 mg total) by mouth every 6 (six) hours. 12 tablet 0   Allergies  Allergen Reactions  . Peanuts [Peanut Oil]     Hives     I have reviewed the past Medical Hx, Surgical Hx, Social Hx, Allergies and Medications.   REVIEW OF SYSTEMS  OPHTHALMIC: negative for - blurry vision, decreased vision, double vision, photophobia or scotomata RESPIRATORY: no cough, shortness of breath, or wheezing CARDIOVASCULAR: no chest pain or dyspnea on exertion GASTROINTESTINAL: left-sided pelvic pain, no change in bowel habits, or black or bloody stools negative for - epigastric or RUQ pain GENITO-URINARY: mild dysuria, no trouble voiding, or hematuria negative for - genital discharge, vulvar/vaginal symptoms or vaginal bleeding. Left  sided pelvic pain. MUSKULOSKELETAL: negative for - gait disturbance or swelling in ankle - bilateral, foot - bilateral and leg - bilateral NEUROLOGICAL: negative for - dizziness, gait disturbance, headaches, numbness/tingling or visual changes DERMATOLOGICAL: negative OBSTETRICAL: No vaginal bleeding, no leaking fluid, no contractions. Good fetal movement.   OBJECTIVE Patient Vitals for the past 24 hrs:  BP Temp Temp src Pulse Resp SpO2 Height Weight  06/11/16 2304 108/68 98.4 F (36.9 C) Oral (!) 52 16 97 % 5\' 4"  (1.626 m) 112 lb (50.8 kg)    PHYSICAL EXAM Constitutional: Well-developed, well-nourished female in no acute distress.  Cardiovascular: normal rate Respiratory: normal rate and effort.  GI: Abd soft,  non-tender. Pos BS x 4 MS: Extremities nontender, no edema, normal ROM Neurologic: Alert and oriented x 4.  GU: Neg CVAT.  BIMANUAL: cervix closed; uterus normal size, left-sided adnexal tenderness, no masses. No CMT.  LAB RESULTS Results for orders placed or performed during the hospital encounter of 06/11/16 (from the past 24 hour(s))  Urinalysis, Routine w reflex microscopic ([facility])     Status: Abnormal   Collection Time: 06/11/16 11:06 PM  Result Value Ref Range   Color, Urine YELLOW YELLOW   APPearance CLEAR CLEAR   Specific Gravity, Urine >1.030 (H) 1.005 - 1.030   pH 6.0 5.0 - 8.0   Glucose, UA NEGATIVE NEGATIVE mg/dL   Hgb urine dipstick MODERATE (A) NEGATIVE   Bilirubin Urine NEGATIVE NEGATIVE   Ketones, ur NEGATIVE NEGATIVE mg/dL   Protein, ur NEGATIVE NEGATIVE mg/dL   Nitrite NEGATIVE NEGATIVE   Leukocytes, UA NEGATIVE NEGATIVE  Urine microscopic-add on     Status: Abnormal   Collection Time: 06/11/16 11:06 PM  Result Value Ref Range   Squamous Epithelial / LPF 0-5 (A) NONE SEEN   WBC, UA 0-5 0 - 5 WBC/hpf   RBC / HPF 0-5 0 - 5 RBC/hpf   Bacteria, UA RARE (A) NONE SEEN   Urine-Other MUCOUS PRESENT   Pregnancy, urine POC     Status: None   Collection Time: 06/11/16 11:18 PM  Result Value Ref Range   Preg Test, Ur NEGATIVE NEGATIVE  Wet prep, genital     Status: Abnormal   Collection Time: 06/12/16 12:38 AM  Result Value Ref Range   Yeast Wet Prep HPF POC NONE SEEN NONE SEEN   Trich, Wet Prep NONE SEEN NONE SEEN   Clue Cells Wet Prep HPF POC PRESENT (A) NONE SEEN   WBC, Wet Prep HPF POC FEW (A) NONE SEEN   Sperm NONE SEEN     IMAGING No results found.  MAU COURSE  1:53 AM - Pain has subsided, 0/10. Reviewed medications/Rx sending home and to follow up in clinic for this repeat pain.  MDM  ASSESSMENT 1. Bacterial vaginosis   Possible endometriosis  PLAN Discharge home in stable condition. Follow up with Ob/Gyn for chronic pelvic  pain.   Follow-up Information    [facility]   Specialty:  Obstetrics and Gynecology Why:  f/u pelvic pain Contact information: [address] [place] [id number] [phone]           Medication List    TAKE these medications   acetaminophen 500 MG tablet Commonly known as:  TYLENOL Take 500 mg by mouth every 6 (six) hours as needed for mild pain or headache. Reported on 02/01/2016   ibuprofen 600 MG tablet Commonly known as:  ADVIL,MOTRIN Take 1 tablet (600 mg total) by mouth  every 6 (six) hours as needed for moderate pain.   metroNIDAZOLE 500 MG tablet Commonly known as:  FLAGYL Take 1 tablet (500 mg total) by mouth 2 (two) times daily. What changed:  Another medication with the same name was added. Make sure you understand how and when to take each.   metroNIDAZOLE 500 MG tablet Commonly known as:  FLAGYL Take 1 tablet (500 mg total) by mouth 2 (two) times daily. What changed:  You were already taking a medication with the same name, and this prescription was added. Make sure you understand how and when to take each.   ondansetron 4 MG tablet Commonly known as:  ZOFRAN Take 1 tablet (4 mg total) by mouth every 6 (six) hours.        [address]lizabeth Woodland [place], [place] 06/12/2016  1:53 AM

## 2016-06-12 NOTE — Discharge Instructions (Signed)

## 2016-10-21 ENCOUNTER — Emergency Department (INDEPENDENT_AMBULATORY_CARE_PROVIDER_SITE_OTHER)
Admission: EM | Admit: 2016-10-21 | Discharge: 2016-10-21 | Disposition: A | Discharge status: Home / Self Care | Payer: BLUE CROSS/BLUE SHIELD | Source: Home / Self Care | Attending: Family Medicine | Admitting: Family Medicine

## 2016-10-21 ENCOUNTER — Encounter: Payer: Self-pay | Admitting: *Deleted

## 2016-10-21 DIAGNOSIS — R300 Dysuria: Secondary | ICD-10-CM | POA: Diagnosis not present

## 2016-10-21 DIAGNOSIS — N3090 Cystitis, unspecified without hematuria: Secondary | ICD-10-CM

## 2016-10-21 LAB — POCT URINALYSIS DIP (MANUAL ENTRY)
BILIRUBIN UA: NEGATIVE
Bilirubin, UA: NEGATIVE
Glucose, UA: NEGATIVE
NITRITE UA: NEGATIVE
PH UA: 8.5 (ref 5–8)
Spec Grav, UA: 1.015 (ref 1.005–1.03)
UROBILINOGEN UA: 1 (ref 0–1)

## 2016-10-21 LAB — POCT URINE PREGNANCY: PREG TEST UR: NEGATIVE

## 2016-10-21 MED ORDER — NITROFURANTOIN MONOHYD MACRO 100 MG PO CAPS: 100.0000 mg | ORAL_CAPSULE | Freq: Two times a day (BID) | ORAL | 0 refills | Status: DC

## 2016-10-21 NOTE — Discharge Instructions (Signed)
Increase fluid intake. May use non-prescription AZO for about two days, if desired, to decrease urinary discomfort.  If symptoms become significantly worse during the night or over the weekend, proceed to the local emergency room.  

## 2016-10-21 NOTE — ED Provider Notes (Signed)
[facility]    CSN: [id number] Arrival date & time: 10/21/16  1445     History   Chief Complaint Chief Complaint  Patient presents with  . Abdominal Pain  . Dysuria    HPI Tiffany Woodard is a 26 y.o. female.   Patient complains of dysuria and lower abdominal cramping for two weeks.  No fevers, chills, and sweats. No LMP recorded. Patient has had an implant.    The history is provided by the patient.  Dysuria  Pain quality:  Burning Pain severity:  Mild Onset quality:  Gradual Duration:  2 weeks Timing:  Constant Progression:  Worsening Chronicity:  New Recent urinary tract infections: no   Relieved by:  None tried Worsened by:  Nothing Ineffective treatments:  None tried Urinary symptoms: frequent urination and hesitancy   Urinary symptoms: no discolored urine, no foul-smelling urine, no hematuria and no bladder incontinence   Associated symptoms: no abdominal pain, no fever, no flank pain, no genital lesions, no nausea, no vaginal discharge and no vomiting     Past Medical History:  Diagnosis Date  . Anxiety   . Depression   . Medical history non-contributory   . No pertinent past medical history     Patient Active Problem List   Diagnosis Date Noted  . Adjustment disorder with emotional disturbance 06/02/2014    Past Surgical History:  Procedure Laterality Date  . TONSILLECTOMY    . WISDOM TOOTH EXTRACTION      OB History    Gravida Para Term Preterm AB Living   2 2 2     2    SAB TAB Ectopic Multiple Live Births           2       Home Medications    Prior to Admission medications   Medication Sig Start Date End Date Taking? Authorizing Provider  Acetaminophen-Caff-Pyrilamine (MIDOL COMPLETE PO) Take by mouth.   Yes Historical Provider, MD  nitrofurantoin, macrocrystal-monohydrate, (MACROBID) 100 MG capsule Take 1 capsule (100 mg total) by mouth 2 (two) times daily. Take with food. 10/21/16   Lattie HawStephen A Deneisha Dade, MD    Family  History Family History  Problem Relation Age of Onset  . Heart disease Maternal Grandmother   . Hypertension Maternal Grandmother   . Diabetes Maternal Grandmother   . Other Neg Hx     Social History Social History  Substance Use Topics  . Smoking status: Never Smoker  . Smokeless tobacco: Never Used  . Alcohol use Yes     Comment: occ beer and wine     Allergies   Peanuts [peanut oil]   Review of Systems Review of Systems  Constitutional: Negative for fever.  Gastrointestinal: Negative for abdominal pain, nausea and vomiting.  Genitourinary: Positive for dysuria. Negative for flank pain and vaginal discharge.  All other systems reviewed and are negative.    Physical Exam Triage Vital Signs ED Triage Vitals  Enc Vitals Group     BP 10/21/16 1532 110/74     Pulse Rate 10/21/16 1532 64     Resp 10/21/16 1532 16     Temp 10/21/16 1532 98.3 F (36.8 C)     Temp Source 10/21/16 1532 Oral     SpO2 10/21/16 1532 99 %     Weight 10/21/16 1533 111 lb (50.3 kg)     Height 10/21/16 1533 5\' 3"  (1.6 m)     Head Circumference --      Peak Flow --  Pain Score 10/21/16 1535 0     Pain Loc --      Pain Edu? --      Excl. in GC? --    No data found.   Updated Vital Signs BP 110/74 (BP Location: Left Arm)   Pulse 64   Temp 98.3 F (36.8 C) (Oral)   Resp 16   Ht 5\' 3"  (1.6 m)   Wt 111 lb (50.3 kg)   SpO2 99%   BMI 19.66 kg/m   Visual Acuity Right Eye Distance:   Left Eye Distance:   Bilateral Distance:    Right Eye Near:   Left Eye Near:    Bilateral Near:     Physical Exam Nursing notes and Vital Signs reviewed. Appearance:  Patient appears stated age, and in no acute distress.    Eyes:  Pupils are equal, round, and reactive to light and accomodation.  Extraocular movement is intact.  Conjunctivae are not inflamed   Pharynx:  Normal; moist mucous membranes  Neck:  Supple.  No adenopathy Lungs:  Clear to auscultation.  Breath sounds are equal.  Moving  air well. Heart:  Regular rate and rhythm without murmurs, rubs, or gallops.  Abdomen:  Nontender without masses or hepatosplenomegaly.  Bowel sounds are present.  No CVA or flank tenderness.  Extremities:  No edema.  Skin:  No rash present.     UC Treatments / Results  Labs (all labs ordered are listed, but only abnormal results are displayed) Labs Reviewed  POCT URINALYSIS DIP (MANUAL ENTRY) - Abnormal; Notable for the following:       Result Value   Clarity, UA cloudy (*)    Blood, UA large (*)    Protein Ur, POC =100 (*)    Leukocytes, UA small (1+) (*)    All other components within normal limits  URINE CULTURE  POCT URINE PREGNANCY negative    EKG  EKG Interpretation None       Radiology No results found.  Procedures Procedures (including critical care time)  Medications Ordered in UC Medications - No data to display   Initial Impression / Assessment and Plan / UC Course  I have reviewed the triage vital signs and the nursing notes.  Pertinent labs & imaging results that were available during my care of the patient were reviewed by me and considered in my medical decision making (see chart for details).  Clinical Course   Urine culture pending. Begin Macrobid 100mg  BID for one week. Increase fluid intake. If symptoms become significantly worse during the night or over the weekend, proceed to the local emergency room.  Followup with Family Doctor if not improved in one week.      Final Clinical Impressions(s) / UC Diagnoses   Final diagnoses:  Dysuria  Cystitis    New Prescriptions New Prescriptions   NITROFURANTOIN, MACROCRYSTAL-MONOHYDRATE, (MACROBID) 100 MG CAPSULE    Take 1 capsule (100 mg total) by mouth 2 (two) times daily. Take with food.     Lattie Haw, MD 11/10/16 [phone]

## 2016-10-21 NOTE — ED Triage Notes (Signed)
Pt c/o dysuria and lower abd cramping x 2 wks. Denies fever.

## 2016-10-23 ENCOUNTER — Telehealth: Payer: Self-pay | Admitting: *Deleted

## 2016-10-23 LAB — URINE CULTURE: ORGANISM ID, BACTERIA: NO GROWTH

## 2016-10-23 NOTE — Telephone Encounter (Signed)
Callback: No answer, LMOM f/u from visit call back as needed. UCX neg.

## 2017-02-20 ENCOUNTER — Emergency Department (HOSPITAL_COMMUNITY): Payer: BLUE CROSS/BLUE SHIELD

## 2017-02-20 ENCOUNTER — Emergency Department (HOSPITAL_COMMUNITY)
Admission: EM | Admit: 2017-02-20 | Discharge: 2017-02-20 | Disposition: A | Discharge status: Home / Self Care | Payer: BLUE CROSS/BLUE SHIELD | Attending: Emergency Medicine | Admitting: Emergency Medicine

## 2017-02-20 ENCOUNTER — Other Ambulatory Visit: Payer: Self-pay

## 2017-02-20 ENCOUNTER — Encounter (HOSPITAL_COMMUNITY): Payer: Self-pay

## 2017-02-20 DIAGNOSIS — Z5321 Procedure and treatment not carried out due to patient leaving prior to being seen by health care provider: Secondary | ICD-10-CM | POA: Insufficient documentation

## 2017-02-20 DIAGNOSIS — R079 Chest pain, unspecified: Secondary | ICD-10-CM | POA: Diagnosis not present

## 2017-02-20 NOTE — ED Notes (Signed)
Called 3X to re-assess vitals; no response

## 2017-02-20 NOTE — ED Notes (Signed)
Called for vitals update. No response
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
## 2017-03-13 DIAGNOSIS — Z113 Encounter for screening for infections with a predominantly sexual mode of transmission: Secondary | ICD-10-CM | POA: Diagnosis not present

## 2017-03-13 DIAGNOSIS — Z114 Encounter for screening for human immunodeficiency virus [HIV]: Secondary | ICD-10-CM | POA: Diagnosis not present

## 2017-03-17 ENCOUNTER — Emergency Department (HOSPITAL_COMMUNITY)
Admission: EM | Admit: 2017-03-17 | Discharge: 2017-03-18 | Disposition: A | Discharge status: Home / Self Care | Payer: Worker's Compensation | Attending: Emergency Medicine | Admitting: Emergency Medicine

## 2017-03-17 ENCOUNTER — Encounter (HOSPITAL_COMMUNITY): Payer: Self-pay | Admitting: *Deleted

## 2017-03-17 ENCOUNTER — Emergency Department (HOSPITAL_COMMUNITY): Payer: Worker's Compensation

## 2017-03-17 DIAGNOSIS — S8991XA Unspecified injury of right lower leg, initial encounter: Secondary | ICD-10-CM | POA: Diagnosis present

## 2017-03-17 DIAGNOSIS — S83511A Sprain of anterior cruciate ligament of right knee, initial encounter: Secondary | ICD-10-CM

## 2017-03-17 DIAGNOSIS — S82401A Unspecified fracture of shaft of right fibula, initial encounter for closed fracture: Secondary | ICD-10-CM | POA: Insufficient documentation

## 2017-03-17 DIAGNOSIS — Z91010 Allergy to peanuts: Secondary | ICD-10-CM | POA: Insufficient documentation

## 2017-03-17 DIAGNOSIS — Y9289 Other specified places as the place of occurrence of the external cause: Secondary | ICD-10-CM | POA: Diagnosis not present

## 2017-03-17 DIAGNOSIS — M79604 Pain in right leg: Secondary | ICD-10-CM | POA: Diagnosis not present

## 2017-03-17 DIAGNOSIS — Y939 Activity, unspecified: Secondary | ICD-10-CM | POA: Diagnosis not present

## 2017-03-17 DIAGNOSIS — T1490XA Injury, unspecified, initial encounter: Secondary | ICD-10-CM

## 2017-03-17 DIAGNOSIS — S82831A Other fracture of upper and lower end of right fibula, initial encounter for closed fracture: Secondary | ICD-10-CM

## 2017-03-17 DIAGNOSIS — Y990 Civilian activity done for income or pay: Secondary | ICD-10-CM | POA: Insufficient documentation

## 2017-03-17 DIAGNOSIS — R52 Pain, unspecified: Secondary | ICD-10-CM | POA: Diagnosis not present

## 2017-03-17 LAB — I-STAT BETA HCG BLOOD, ED (MC, WL, AP ONLY)

## 2017-03-17 LAB — CBC WITH DIFFERENTIAL/PLATELET
BASOS ABS: 0 10*3/uL (ref 0.0–0.1)
BASOS PCT: 0 %
EOS PCT: 1 %
Eosinophils Absolute: 0.1 10*3/uL (ref 0.0–0.7)
HEMATOCRIT: 36.1 % (ref 36.0–46.0)
Hemoglobin: 12 g/dL (ref 12.0–15.0)
LYMPHS PCT: 46 %
Lymphs Abs: 5 10*3/uL — ABNORMAL HIGH (ref 0.7–4.0)
MCH: 30.9 pg (ref 26.0–34.0)
MCHC: 33.2 g/dL (ref 30.0–36.0)
MCV: 93 fL (ref 78.0–100.0)
MONO ABS: 0.5 10*3/uL (ref 0.1–1.0)
MONOS PCT: 5 %
Neutro Abs: 5.1 10*3/uL (ref 1.7–7.7)
Neutrophils Relative %: 48 %
PLATELETS: 227 10*3/uL (ref 150–400)
RBC: 3.88 MIL/uL (ref 3.87–5.11)
RDW: 13.3 % (ref 11.5–15.5)
WBC: 10.7 10*3/uL — ABNORMAL HIGH (ref 4.0–10.5)

## 2017-03-17 MED ORDER — FENTANYL CITRATE (PF) 100 MCG/2ML IJ SOLN
50.0000 ug | Freq: Once | INTRAMUSCULAR | Status: AC
  Administered 2017-03-17: 50 ug via INTRAVENOUS
  Filled 2017-03-17: qty 2
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
## 2017-03-17 NOTE — ED Notes (Signed)
Pt to xray dept no portable xrays performed

## 2017-03-17 NOTE — ED Notes (Signed)
fentabyl 50 mcg iv per emily rn

## 2017-03-18 ENCOUNTER — Emergency Department (HOSPITAL_COMMUNITY): Payer: Worker's Compensation

## 2017-03-18 LAB — COMPREHENSIVE METABOLIC PANEL
ALK PHOS: 38 U/L (ref 38–126)
ALT: 19 U/L (ref 14–54)
AST: 26 U/L (ref 15–41)
Albumin: 3.9 g/dL (ref 3.5–5.0)
Anion gap: 7 (ref 5–15)
BILIRUBIN TOTAL: 0.6 mg/dL (ref 0.3–1.2)
BUN: 9 mg/dL (ref 6–20)
CALCIUM: 8.8 mg/dL — AB (ref 8.9–10.3)
CO2: 22 mmol/L (ref 22–32)
CREATININE: 0.93 mg/dL (ref 0.44–1.00)
Chloride: 105 mmol/L (ref 101–111)
Glucose, Bld: 105 mg/dL — ABNORMAL HIGH (ref 65–99)
Potassium: 3.3 mmol/L — ABNORMAL LOW (ref 3.5–5.1)
Sodium: 134 mmol/L — ABNORMAL LOW (ref 135–145)
TOTAL PROTEIN: 6.7 g/dL (ref 6.5–8.1)

## 2017-03-18 MED ORDER — HYDROCODONE-ACETAMINOPHEN 5-325 MG PO TABS: 1.0000 | ORAL_TABLET | ORAL | 0 refills | Status: DC | PRN

## 2017-03-18 MED ORDER — FENTANYL CITRATE (PF) 100 MCG/2ML IJ SOLN
50.0000 ug | Freq: Once | INTRAMUSCULAR | Status: AC
  Administered 2017-03-18: 50 ug via INTRAVENOUS
  Filled 2017-03-18: qty 2

## 2017-03-18 NOTE — ED Notes (Signed)
Pt taken back to c-t and returned to the room just now

## 2017-03-18 NOTE — Discharge Instructions (Signed)
°  Keep your leg elevated and apply ice. Do not put weight on the leg. Follow-up with orthopedic doctor regarding your fracture and ligament problem in your knee. Return to the ED if you develop new or worsening symptoms.

## 2017-03-18 NOTE — ED Notes (Signed)
Pain is better 

## 2017-03-18 NOTE — ED Notes (Signed)
C/o pain   Given pain med
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
## 2017-03-18 NOTE — Progress Notes (Signed)
Orthopedic Tech Progress Note Patient Details:  Tiffany Woodard [birth year] [id number]  Ortho Devices Type of Ortho Device: Crutches, Knee Immobilizer Ortho Device/Splint Location: rle Ortho Device/Splint Interventions: Ordered, Application, Adjustment   Trinna PostMartinez, Eldredge Veldhuizen J 03/18/2017, 4:01 AM

## 2017-03-18 NOTE — ED Notes (Signed)
Pt back briefly from xray   They came and took her back to \\xray    Work people at her bedside

## 2017-12-18 DIAGNOSIS — Z113 Encounter for screening for infections with a predominantly sexual mode of transmission: Secondary | ICD-10-CM | POA: Diagnosis not present

## 2017-12-18 DIAGNOSIS — R87612 Low grade squamous intraepithelial lesion on cytologic smear of cervix (LGSIL): Secondary | ICD-10-CM | POA: Diagnosis not present

## 2017-12-18 DIAGNOSIS — Z01419 Encounter for gynecological examination (general) (routine) without abnormal findings: Secondary | ICD-10-CM | POA: Diagnosis not present

## 2017-12-18 DIAGNOSIS — R300 Dysuria: Secondary | ICD-10-CM | POA: Diagnosis not present

## 2017-12-18 DIAGNOSIS — Z114 Encounter for screening for human immunodeficiency virus [HIV]: Secondary | ICD-10-CM | POA: Diagnosis not present

## 2018-02-02 DIAGNOSIS — Z01419 Encounter for gynecological examination (general) (routine) without abnormal findings: Secondary | ICD-10-CM | POA: Diagnosis not present

## 2018-02-02 DIAGNOSIS — Z681 Body mass index (BMI) 19 or less, adult: Secondary | ICD-10-CM | POA: Diagnosis not present

## 2018-02-02 DIAGNOSIS — Z803 Family history of malignant neoplasm of breast: Secondary | ICD-10-CM | POA: Diagnosis not present

## 2018-02-02 DIAGNOSIS — Z8049 Family history of malignant neoplasm of other genital organs: Secondary | ICD-10-CM | POA: Diagnosis not present

## 2018-02-02 DIAGNOSIS — Z113 Encounter for screening for infections with a predominantly sexual mode of transmission: Secondary | ICD-10-CM | POA: Diagnosis not present

## 2018-03-04 DIAGNOSIS — Z681 Body mass index (BMI) 19 or less, adult: Secondary | ICD-10-CM | POA: Diagnosis not present

## 2018-03-04 DIAGNOSIS — Z3046 Encounter for surveillance of implantable subdermal contraceptive: Secondary | ICD-10-CM | POA: Diagnosis not present

## 2018-03-05 DIAGNOSIS — Z3202 Encounter for pregnancy test, result negative: Secondary | ICD-10-CM | POA: Diagnosis not present

## 2018-03-05 DIAGNOSIS — Z681 Body mass index (BMI) 19 or less, adult: Secondary | ICD-10-CM | POA: Diagnosis not present

## 2018-03-05 DIAGNOSIS — Z3042 Encounter for surveillance of injectable contraceptive: Secondary | ICD-10-CM | POA: Diagnosis not present

## 2018-03-26 DIAGNOSIS — Z681 Body mass index (BMI) 19 or less, adult: Secondary | ICD-10-CM | POA: Diagnosis not present

## 2018-03-26 DIAGNOSIS — Z803 Family history of malignant neoplasm of breast: Secondary | ICD-10-CM | POA: Diagnosis not present

## 2018-04-12 DIAGNOSIS — R87612 Low grade squamous intraepithelial lesion on cytologic smear of cervix (LGSIL): Secondary | ICD-10-CM | POA: Diagnosis not present

## 2018-04-12 DIAGNOSIS — N72 Inflammatory disease of cervix uteri: Secondary | ICD-10-CM | POA: Diagnosis not present

## 2018-04-12 DIAGNOSIS — Z681 Body mass index (BMI) 19 or less, adult: Secondary | ICD-10-CM | POA: Diagnosis not present

## 2018-04-12 DIAGNOSIS — R87810 Cervical high risk human papillomavirus (HPV) DNA test positive: Secondary | ICD-10-CM | POA: Diagnosis not present

## 2018-05-01 ENCOUNTER — Inpatient Hospital Stay (HOSPITAL_COMMUNITY)
Admission: AD | Admit: 2018-05-01 | Discharge: 2018-05-01 | Disposition: A | Discharge status: Home / Self Care | Payer: BLUE CROSS/BLUE SHIELD | Source: Ambulatory Visit | Attending: Obstetrics and Gynecology | Admitting: Obstetrics and Gynecology

## 2018-05-01 ENCOUNTER — Other Ambulatory Visit: Payer: Self-pay

## 2018-05-01 ENCOUNTER — Encounter (HOSPITAL_COMMUNITY): Payer: Self-pay

## 2018-05-01 DIAGNOSIS — N939 Abnormal uterine and vaginal bleeding, unspecified: Secondary | ICD-10-CM | POA: Diagnosis not present

## 2018-05-01 DIAGNOSIS — R109 Unspecified abdominal pain: Secondary | ICD-10-CM | POA: Insufficient documentation

## 2018-05-01 DIAGNOSIS — Z202 Contact with and (suspected) exposure to infections with a predominantly sexual mode of transmission: Secondary | ICD-10-CM | POA: Diagnosis not present

## 2018-05-01 DIAGNOSIS — B9689 Other specified bacterial agents as the cause of diseases classified elsewhere: Secondary | ICD-10-CM

## 2018-05-01 DIAGNOSIS — N760 Acute vaginitis: Secondary | ICD-10-CM

## 2018-05-01 DIAGNOSIS — F419 Anxiety disorder, unspecified: Secondary | ICD-10-CM | POA: Diagnosis not present

## 2018-05-01 DIAGNOSIS — F329 Major depressive disorder, single episode, unspecified: Secondary | ICD-10-CM | POA: Insufficient documentation

## 2018-05-01 LAB — URINALYSIS, ROUTINE W REFLEX MICROSCOPIC
BACTERIA UA: NONE SEEN
Bilirubin Urine: NEGATIVE
Glucose, UA: NEGATIVE mg/dL
KETONES UR: NEGATIVE mg/dL
Leukocytes, UA: NEGATIVE
NITRITE: NEGATIVE
Protein, ur: NEGATIVE mg/dL
Specific Gravity, Urine: 1.017 (ref 1.005–1.030)
pH: 6 (ref 5.0–8.0)

## 2018-05-01 LAB — WET PREP, GENITAL
SPERM: NONE SEEN
Trich, Wet Prep: NONE SEEN
YEAST WET PREP: NONE SEEN

## 2018-05-01 LAB — PREGNANCY, URINE: PREG TEST UR: NEGATIVE

## 2018-05-01 MED ORDER — METRONIDAZOLE 500 MG PO TABS: 500.0000 mg | ORAL_TABLET | Freq: Two times a day (BID) | ORAL | 0 refills | Status: DC

## 2018-05-01 NOTE — Discharge Instructions (Signed)

## 2018-05-01 NOTE — Progress Notes (Addendum)
Here for vaginal bleeding and pain that started   Clean catch Urine ordered. Verified stickers with pt and affixed Labeled on the cup. Tech made aware.  Provider at bs assessing. wetprep and GC done and sent to lab  1558 Lab notified for stat labs  1624: provider at bs reassessing. Went over the results of the wetprep.   Pt made aware of BV. meds sent to her pharmacy. Pt informed to pick up at the pharmacy.  1639: D/c instructions given with pt understanding
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
## 2018-05-02 LAB — RPR: RPR: NONREACTIVE

## 2018-05-02 LAB — HIV ANTIBODY (ROUTINE TESTING W REFLEX): HIV Screen 4th Generation wRfx: NONREACTIVE

## 2018-05-02 LAB — HEPATITIS PANEL, ACUTE
HCV Ab: <0.1 s/co ratio (ref 0.0–0.9)
HEP A IGM: NEGATIVE
HEP B C IGM: NEGATIVE
Hepatitis B Surface Ag: NEGATIVE

## 2018-05-03 LAB — GC/CHLAMYDIA PROBE AMP (~~LOC~~) NOT AT ARMC
Chlamydia: NEGATIVE
NEISSERIA GONORRHEA: NEGATIVE

## 2018-05-28 DIAGNOSIS — Z681 Body mass index (BMI) 19 or less, adult: Secondary | ICD-10-CM | POA: Diagnosis not present

## 2018-05-28 DIAGNOSIS — Z3042 Encounter for surveillance of injectable contraceptive: Secondary | ICD-10-CM | POA: Diagnosis not present

## 2018-08-10 ENCOUNTER — Emergency Department (HOSPITAL_COMMUNITY)
Admission: EM | Admit: 2018-08-10 | Discharge: 2018-08-10 | Disposition: A | Discharge status: Home / Self Care | Payer: No Typology Code available for payment source | Attending: Emergency Medicine | Admitting: Emergency Medicine

## 2018-08-10 ENCOUNTER — Other Ambulatory Visit: Payer: Self-pay

## 2018-08-10 ENCOUNTER — Emergency Department (HOSPITAL_COMMUNITY): Payer: No Typology Code available for payment source

## 2018-08-10 ENCOUNTER — Encounter (HOSPITAL_COMMUNITY): Payer: Self-pay | Admitting: Emergency Medicine

## 2018-08-10 DIAGNOSIS — Y9389 Activity, other specified: Secondary | ICD-10-CM | POA: Insufficient documentation

## 2018-08-10 DIAGNOSIS — W19XXXA Unspecified fall, initial encounter: Secondary | ICD-10-CM | POA: Diagnosis not present

## 2018-08-10 DIAGNOSIS — Z79899 Other long term (current) drug therapy: Secondary | ICD-10-CM | POA: Diagnosis not present

## 2018-08-10 DIAGNOSIS — Y990 Civilian activity done for income or pay: Secondary | ICD-10-CM | POA: Diagnosis not present

## 2018-08-10 DIAGNOSIS — Z91010 Allergy to peanuts: Secondary | ICD-10-CM | POA: Diagnosis not present

## 2018-08-10 DIAGNOSIS — R42 Dizziness and giddiness: Secondary | ICD-10-CM | POA: Diagnosis not present

## 2018-08-10 DIAGNOSIS — Y929 Unspecified place or not applicable: Secondary | ICD-10-CM | POA: Diagnosis not present

## 2018-08-10 DIAGNOSIS — S060X9A Concussion with loss of consciousness of unspecified duration, initial encounter: Secondary | ICD-10-CM | POA: Diagnosis not present

## 2018-08-10 DIAGNOSIS — S0990XA Unspecified injury of head, initial encounter: Secondary | ICD-10-CM

## 2018-08-10 DIAGNOSIS — W01198A Fall on same level from slipping, tripping and stumbling with subsequent striking against other object, initial encounter: Secondary | ICD-10-CM | POA: Diagnosis not present

## 2018-08-10 LAB — CBG MONITORING, ED: Glucose-Capillary: 94 mg/dL (ref 70–99)

## 2018-08-10 MED ORDER — ONDANSETRON HCL 4 MG PO TABS: 4.0000 mg | ORAL_TABLET | Freq: Four times a day (QID) | ORAL | 0 refills | Status: DC

## 2018-08-10 MED ORDER — ACETAMINOPHEN 500 MG PO TABS
1000.0000 mg | ORAL_TABLET | Freq: Once | ORAL | Status: AC
  Administered 2018-08-10: 1000 mg via ORAL
  Filled 2018-08-10: qty 2

## 2018-08-10 NOTE — ED Provider Notes (Signed)
[facility] EMERGENCY DEPARTMENT Provider Note   CSN: [id number] Arrival date & time: 08/10/18  0845     History   Chief Complaint Chief Complaint  Patient presents with  . Loss of Consciousness    HPI Tiffany Woodard is a 27 y.o. female.  27 year old female with prior medical history as detailed below presents following a closed head injury with loss of conscious.  Patient was at work when she was lifting a heavy box.  She lost control the box and fell backwards.  She struck her head against a pole.  Apparently she had a brief LOC following this fall.  She now complains of diffuse headache.  She denies neck pain.  She denies other extremity injury.  She did not vomit.  She denies visual changes.  The history is provided by the patient.  Head Injury   The incident occurred 1 to 2 hours ago. She came to the ER via EMS. The injury mechanism was a direct blow. She lost consciousness for a period of 1 to 5 minutes. There was no blood loss. The quality of the pain is described as dull and throbbing. The pain is mild. The pain has been constant since the injury. Pertinent negatives include no numbness, no blurred vision, no vomiting, no tinnitus, no disorientation, no weakness and no memory loss. She was found conscious by EMS personnel. She has tried nothing for the symptoms.    Past Medical History:  Diagnosis Date  . Anxiety    no meds  . Depression    no meds  . Medical history non-contributory   . No pertinent past medical history     Patient Active Problem List   Diagnosis Date Noted  . Adjustment disorder with emotional disturbance 06/02/2014    Past Surgical History:  Procedure Laterality Date  . TONSILLECTOMY    . WISDOM TOOTH EXTRACTION       OB History    Gravida  2   Para  2   Term  2   Preterm      AB      Living  2     SAB      TAB      Ectopic      Multiple      Live Births  2            Home Medications     Prior to Admission medications   Medication Sig Start Date End Date Taking? Authorizing Provider  acetaminophen (TYLENOL) 500 MG tablet Take 1,000 mg by mouth every 6 (six) hours as needed for mild pain.   Yes [provider]  medroxyPROGESTERone (DEPO-PROVERA) 150 MG/ML injection Inject 150 mg into the muscle every 3 (three) months.   Yes [provider]  HYDROcodone-acetaminophen (NORCO/VICODIN) 5-325 MG tablet Take 1 tablet by mouth every 4 (four) hours as needed. Patient not taking: Reported on 08/10/2018 03/18/17   Rancour, Jeannett Senior, MD  ondansetron (ZOFRAN) 4 MG tablet Take 1 tablet (4 mg total) by mouth every 6 (six) hours. 08/10/18   Wynetta Fines, MD    Family History Family History  Problem Relation Age of Onset  . Heart disease Maternal Grandmother   . Hypertension Maternal Grandmother   . Diabetes Maternal Grandmother   . Other Neg Hx     Social History Social History   Tobacco Use  . Smoking status: Never Smoker  . Smokeless tobacco: Never Used  Substance Use Topics  . Alcohol  use: Yes    Comment: occ beer and wine  . Drug use: No     Allergies   Peanuts [peanut oil]   Review of Systems Review of Systems  HENT: Negative for tinnitus.   Eyes: Negative for blurred vision.  Gastrointestinal: Negative for vomiting.  Neurological: Negative for weakness and numbness.  Psychiatric/Behavioral: Negative for memory loss.  All other systems reviewed and are negative.    Physical Exam Updated Vital Signs BP 110/69   Pulse (!) 56   Resp 16   Ht 5\' 3"  (1.6 m)   Wt 48 kg   SpO2 100%   BMI 18.75 kg/m   Physical Exam  Constitutional: She is oriented to person, place, and time. She appears well-developed and well-nourished. No distress.  HENT:  Head: Normocephalic and atraumatic.  Mouth/Throat: Oropharynx is clear and moist.  Eyes: Pupils are equal, round, and reactive to light. Conjunctivae and EOM are normal.  Neck: Normal range of  motion. Neck supple.  Cardiovascular: Normal rate, regular rhythm and normal heart sounds.  Pulmonary/Chest: Effort normal and breath sounds normal. No respiratory distress.  Abdominal: Soft. She exhibits no distension. There is no tenderness.  Musculoskeletal: Normal range of motion. She exhibits no edema or deformity.  Neurological: She is alert and oriented to person, place, and time.  GCS 15  Skin: Skin is warm and dry.  Psychiatric: She has a normal mood and affect.  Nursing note and vitals reviewed.    ED Treatments / Results  Labs (all labs ordered are listed, but only abnormal results are displayed) Labs Reviewed  CBG MONITORING, ED  I-STAT BETA HCG BLOOD, ED ([facility], [facility], [facility] ONLY)    EKG None  Radiology Ct Head Wo Contrast  Result Date: 08/10/2018 CLINICAL DATA:  Posttraumatic headache.  Fall and syncope. EXAM: CT HEAD WITHOUT CONTRAST TECHNIQUE: Contiguous axial images were obtained from the base of the skull through the vertex without intravenous contrast. COMPARISON:  03/02/2015 FINDINGS: Brain: No evidence of acute infarction, hemorrhage, hydrocephalus, extra-axial collection or mass lesion/mass effect. Vascular: No hyperdense vessel or unexpected calcification. Skull: Normal. Negative for fracture or focal lesion. Sinuses/Orbits: Negative IMPRESSION: Negative head CT. Electronically Signed   By: Marnee Spring M.D.   On: 08/10/2018 10:00    Procedures Procedures (including critical care time)  Medications Ordered in ED Medications  acetaminophen (TYLENOL) tablet 1,000 mg (has no administration in time range)     Initial Impression / Assessment and Plan / ED Course  I have reviewed the triage vital signs and the nursing notes.  Pertinent labs & imaging results that were available during my care of the patient were reviewed by me and considered in my medical decision making (see chart for details).     MDM  Screen complete  Patient is presenting following a  closed head injury with brief LOC.  Symptoms are consistent with likely concussion.  CT imaging does not reveal acute intercranial injury.  Patient without other evidence of significant traumatic injury.  Patient appears to be stable for discharge home.  Importance of close follow-up was stressed.  Strict return precautions given and understood.   Basic instructions given regarding outpatient management of concussion.  Final Clinical Impressions(s) / ED Diagnoses   Final diagnoses:  Closed head injury, initial encounter    ED Discharge Orders         Ordered    ondansetron (ZOFRAN) 4 MG tablet  Every 6 hours     08/10/18 1024  Wynetta Fines, MD 08/10/18 1028

## 2018-08-10 NOTE — Discharge Instructions (Signed)
Please return for any problem.  Follow-up with a regular doctor as instructed in 2 to 3 days. °

## 2018-08-10 NOTE — ED Triage Notes (Signed)
Pt arrives by [employer] from work after having a syncopal event. Pt remembers being at work and trying to lift a heavy box above head and fell due to weight of the box. Pt states she woke up lying on the ground. Pt has pain to right side of head. Pt states when she opened her eyes she was dizzy and had trouble focusing her vision. Pt states that has resolved at this time but fel sensitive to the light.

## 2018-08-20 DIAGNOSIS — Z3042 Encounter for surveillance of injectable contraceptive: Secondary | ICD-10-CM | POA: Diagnosis not present

## 2018-08-20 DIAGNOSIS — Z681 Body mass index (BMI) 19 or less, adult: Secondary | ICD-10-CM | POA: Diagnosis not present

## 2018-09-30 DIAGNOSIS — R930 Abnormal findings on diagnostic imaging of skull and head, not elsewhere classified: Secondary | ICD-10-CM | POA: Diagnosis not present

## 2018-11-05 ENCOUNTER — Emergency Department (HOSPITAL_COMMUNITY)
Admission: EM | Admit: 2018-11-05 | Discharge: 2018-11-05 | Disposition: A | Discharge status: Home / Self Care | Payer: BLUE CROSS/BLUE SHIELD | Attending: Emergency Medicine | Admitting: Emergency Medicine

## 2018-11-05 ENCOUNTER — Encounter (HOSPITAL_COMMUNITY): Payer: Self-pay | Admitting: Emergency Medicine

## 2018-11-05 ENCOUNTER — Other Ambulatory Visit: Payer: Self-pay

## 2018-11-05 DIAGNOSIS — R519 Headache, unspecified: Secondary | ICD-10-CM

## 2018-11-05 DIAGNOSIS — R51 Headache: Secondary | ICD-10-CM | POA: Insufficient documentation

## 2018-11-05 DIAGNOSIS — R42 Dizziness and giddiness: Secondary | ICD-10-CM | POA: Insufficient documentation

## 2018-11-05 LAB — POC URINE PREG, ED: PREG TEST UR: NEGATIVE

## 2018-11-05 MED ORDER — METOCLOPRAMIDE HCL 5 MG/ML IJ SOLN
10.0000 mg | Freq: Once | INTRAMUSCULAR | Status: AC
  Administered 2018-11-05: 10 mg via INTRAVENOUS
  Filled 2018-11-05: qty 2

## 2018-11-05 MED ORDER — DIPHENHYDRAMINE HCL 50 MG/ML IJ SOLN
12.5000 mg | Freq: Once | INTRAMUSCULAR | Status: AC
  Administered 2018-11-05: 12.5 mg via INTRAVENOUS
  Filled 2018-11-05: qty 1

## 2018-11-05 MED ORDER — SODIUM CHLORIDE 0.9 % IV BOLUS
1000.0000 mL | Freq: Once | INTRAVENOUS | Status: AC
  Administered 2018-11-05: 1000 mL via INTRAVENOUS

## 2018-11-05 NOTE — ED Provider Notes (Signed)
[facility] EMERGENCY DEPARTMENT Provider Note   CSN: [id number] Arrival date & time: 11/05/18  1005     History   Chief Complaint Chief Complaint  Patient presents with  . Headache    HPI Tiffany Woodard is a 28 y.o. female with past medical history of anxiety, depression, adjustment disorder, presenting to the emergency department with complaint of gradually worsening intermittent headaches since recent head injury and October 2019.  Patient states this all started when she had a concussion with LOC in October after a transmission fell on her head.  She was having recurrent headaches after the injury and was [place] to a neurologist in [place] who performed an outpatient MRI.  Per chart review, the MRI was negative for acute intracranial abnormality, however showed multiple flair hyperintense lesions which were described as nonspecific.  She states her neurologist plans to work her up for other chronic disorders with blood work, however has not had that work-up yet.  Her headaches are described as intermittent, however worsening the past few days.  It is described as throbbing with photophobia and intermittent nausea.  Today she began having room spinning dizziness that was worse with standing and position change.  The dizziness is new and therefore she reported to the ED.  She treats her headaches with Advil.  No vision changes, neck pain or stiffness.  No reinjury.  No numbness or weakness.  No history of vertigo.  The history is provided by the patient.    Past Medical History:  Diagnosis Date  . Anxiety    no meds  . Depression    no meds  . Medical history non-contributory   . No pertinent past medical history     Patient Active Problem List   Diagnosis Date Noted  . Adjustment disorder with emotional disturbance 06/02/2014    Past Surgical History:  Procedure Laterality Date  . TONSILLECTOMY    . WISDOM TOOTH EXTRACTION       OB History    Gravida  2   Para  2   Term  2   Preterm      AB      Living  2     SAB      TAB      Ectopic      Multiple      Live Births  2            Home Medications    Prior to Admission medications   Medication Sig Start Date End Date Taking? Authorizing Provider  acetaminophen (TYLENOL) 500 MG tablet Take 1,000 mg by mouth every 6 (six) hours as needed for mild pain.    [provider]  HYDROcodone-acetaminophen (NORCO/VICODIN) 5-325 MG tablet Take 1 tablet by mouth every 4 (four) hours as needed. Patient not taking: Reported on 08/10/2018 03/18/17   Glynn Octaveancour, Stephen, MD  medroxyPROGESTERone (DEPO-PROVERA) 150 MG/ML injection Inject 150 mg into the muscle every 3 (three) months.    [provider]  ondansetron (ZOFRAN) 4 MG tablet Take 1 tablet (4 mg total) by mouth every 6 (six) hours. 08/10/18   Wynetta FinesMessick, Peter C, MD    Family History Family History  Problem Relation Age of Onset  . Heart disease Maternal Grandmother   . Hypertension Maternal Grandmother   . Diabetes Maternal Grandmother   . Other Neg Hx     Social History Social History   Tobacco Use  . Smoking status: Never Smoker  . Smokeless tobacco:  Never Used  Substance Use Topics  . Alcohol use: Yes    Comment: occ beer and wine  . Drug use: No     Allergies   Peanuts [peanut oil]   Review of Systems Review of Systems  Constitutional: Negative for fever.  Eyes: Positive for photophobia. Negative for visual disturbance.  Gastrointestinal: Positive for nausea and vomiting. Negative for abdominal pain.  Neurological: Positive for dizziness and headaches. Negative for weakness and numbness.  All other systems reviewed and are negative.    Physical Exam Updated Vital Signs BP 103/74 (BP Location: Left Arm)   Pulse 75   Temp 98.2 F (36.8 C) (Oral)   Resp 18   Ht 5\' 4"  (1.626 m)   Wt 48.5 kg   SpO2 100%   BMI 18.37 kg/m   Physical Exam Vitals signs and nursing note  reviewed.  Constitutional:      Appearance: She is well-developed.  HENT:     Head: Normocephalic and atraumatic.  Eyes:     Conjunctiva/sclera: Conjunctivae normal.  Pulmonary:     Effort: Pulmonary effort is normal.  Abdominal:     Palpations: Abdomen is soft.  Skin:    General: Skin is warm.  Neurological:     Mental Status: She is alert.  Psychiatric:        Behavior: Behavior normal.      ED Treatments / Results  Labs (all labs ordered are listed, but only abnormal results are displayed) Labs Reviewed  POC URINE PREG, ED    EKG None  Radiology No results found.  Procedures Procedures (including critical care time)  Medications Ordered in ED Medications  sodium chloride 0.9 % bolus 1,000 mL (0 mLs Intravenous Stopped 11/05/18 1302)  metoCLOPramide (REGLAN) injection 10 mg (10 mg Intravenous Given 11/05/18 1053)  diphenhydrAMINE (BENADRYL) injection 12.5 mg (12.5 mg Intravenous Given 11/05/18 1054)     Initial Impression / Assessment and Plan / ED Course  I have reviewed the triage vital signs and the nursing notes.  Pertinent labs & imaging results that were available during my care of the patient were reviewed by me and considered in my medical decision making (see chart for details).  Clinical Course as of Nov 05 1416  Fri Nov 05, 2018  1230 Patient with improvement in symptoms after migraine cocktail.  States she feels much better.  Patient was previously discussed with Dr. Hyacinth Meeker, who agrees with discharge with outpatient neurology follow-up.   [JR]    Clinical Course User Index [JR] Leisl Spurrier, [place] N, PA-C    Patient with recurrent headaches followed by neurology, presenting with gradually worsening headaches for few days.  Headaches are intermittent.  Today she developed intermittent room spinning dizziness that is positional and reproducible on exam today.  No neurologic deficits.  No red flags, no vision changes, fever, neck pain or rigidity.  No  new injury.  Patient discussed with Dr. Hyacinth Meeker.  Migraine cocktail provided significant improvement in symptoms.  Do not believe repeat imaging is indicated at this time.  Patient ambulatory in the ED and agreeable to discharge with follow-up with her neurologist.  Strict return precautions discussed.  Patient verbalized understanding and agrees with care plan.  Discussed results, findings, treatment and follow up. Patient advised of return precautions. Patient verbalized understanding and agreed with plan.   Final Clinical Impressions(s) / ED Diagnoses   Final diagnoses:  Intermittent headache  Dizziness    ED Discharge Orders    None  Chelesa Weingartner, [place] N, PA-C 11/05/18 1418    Eber HongMiller, Brian, MD 11/06/18 [phone]

## 2018-11-05 NOTE — ED Triage Notes (Signed)
Pt arrives to ED from home with complaints of a headache starting last night. Pt reports she is dizzy and feels like the room is spinning. Pt has hx of abnormal MRI and sees a neurologist. Pt placed in position of comfort with bed locked and lowered, call bell in reach.

## 2018-11-05 NOTE — Discharge Instructions (Addendum)
Follow-up with your neurologist regarding your worsening headaches. Return to the emergency department sooner if you develop persistent severe headache, persistent dizziness, vision changes, or new or concerning symptoms.

## 2018-11-05 NOTE — ED Notes (Signed)
Patient given discharge instructions and verbalized understanding.  Patient stable to discharge at this time.  Patient is alert and oriented to baseline.  No distressed noted at this time.  All belongings taken with the patient at discharge.   

## 2018-11-09 ENCOUNTER — Emergency Department (HOSPITAL_COMMUNITY)
Admission: EM | Admit: 2018-11-09 | Discharge: 2018-11-09 | Disposition: A | Discharge status: Home / Self Care | Payer: Self-pay | Attending: Emergency Medicine | Admitting: Emergency Medicine

## 2018-11-09 ENCOUNTER — Other Ambulatory Visit: Payer: Self-pay

## 2018-11-09 ENCOUNTER — Emergency Department (HOSPITAL_COMMUNITY): Payer: Self-pay

## 2018-11-09 DIAGNOSIS — R42 Dizziness and giddiness: Secondary | ICD-10-CM | POA: Insufficient documentation

## 2018-11-09 DIAGNOSIS — R51 Headache: Secondary | ICD-10-CM | POA: Diagnosis not present

## 2018-11-09 DIAGNOSIS — R112 Nausea with vomiting, unspecified: Secondary | ICD-10-CM | POA: Insufficient documentation

## 2018-11-09 DIAGNOSIS — H53149 Visual discomfort, unspecified: Secondary | ICD-10-CM | POA: Insufficient documentation

## 2018-11-09 DIAGNOSIS — S060X1D Concussion with loss of consciousness of 30 minutes or less, subsequent encounter: Secondary | ICD-10-CM | POA: Diagnosis not present

## 2018-11-09 DIAGNOSIS — R0982 Postnasal drip: Secondary | ICD-10-CM | POA: Insufficient documentation

## 2018-11-09 DIAGNOSIS — G44319 Acute post-traumatic headache, not intractable: Secondary | ICD-10-CM | POA: Insufficient documentation

## 2018-11-09 MED ORDER — DEXAMETHASONE 4 MG PO TABS
10.0000 mg | ORAL_TABLET | Freq: Once | ORAL | Status: AC
  Administered 2018-11-09: 10 mg via ORAL
  Filled 2018-11-09: qty 3

## 2018-11-09 MED ORDER — PROCHLORPERAZINE EDISYLATE 10 MG/2ML IJ SOLN
10.0000 mg | Freq: Once | INTRAMUSCULAR | Status: AC
  Administered 2018-11-09: 10 mg via INTRAVENOUS
  Filled 2018-11-09: qty 2

## 2018-11-09 MED ORDER — GADOBUTROL 1 MMOL/ML IV SOLN
4.0000 mL | Freq: Once | INTRAVENOUS | Status: AC | PRN
  Administered 2018-11-09: 4 mL via INTRAVENOUS

## 2018-11-09 MED ORDER — DIPHENHYDRAMINE HCL 50 MG/ML IJ SOLN
25.0000 mg | Freq: Once | INTRAMUSCULAR | Status: AC
  Administered 2018-11-09: 25 mg via INTRAVENOUS
  Filled 2018-11-09: qty 1

## 2018-11-09 NOTE — Discharge Instructions (Signed)
Follow up with your neurologist.  I have attached the concussion clinic number if you would like to see them in the office.

## 2018-11-09 NOTE — ED Triage Notes (Signed)
Pt reports headache and dizziness like the room is spinning. Pt reports this has been going on for weeks with vomiting on Friday and again this morning. Pt reports she did have an MRI done at [facility] in November.

## 2018-11-09 NOTE — ED Provider Notes (Signed)
[facility] EMERGENCY DEPARTMENT Provider Note   CSN: [id number] Arrival date & time: 11/09/18  4098     History   Chief Complaint Chief Complaint  Patient presents with  . Headache  . Dizziness    HPI Tiffany Woodard is a 28 y.o. female.  28 yo F with a chief complaint of a headache.  Patient has had these off and on since she had a accident about 3 months ago.  Stated that something heavy on top of a pallet fell on her head.  Since then she has had throbbing headaches to the posterior aspect of her head.  She feels that they are getting steadily worse.  Usually worse when she bends over or sometimes happens spontaneously while she is at work.  She has had some photophobia some nausea and vomiting.  She denies recent trauma denies unilateral numbness or weakness denies difficulty with speech or swallowing.  She has been feeling dizzy over the past few days.  Worse with head movement and proves while she holds her self still.  She has had some mild congestion and cough.  Denies fevers.  The history is provided by the patient.  Headache  Associated symptoms: dizziness   Associated symptoms: no congestion, no fever, no myalgias, no nausea and no vomiting   Dizziness  Associated symptoms: headaches   Associated symptoms: no chest pain, no nausea, no palpitations, no shortness of breath and no vomiting   Illness  Severity:  Moderate Onset quality:  Gradual Duration:  3 months Timing:  Constant Progression:  Worsening Chronicity:  New Associated symptoms: headaches   Associated symptoms: no chest pain, no congestion, no fever, no myalgias, no nausea, no rhinorrhea, no shortness of breath, no vomiting and no wheezing     Past Medical History:  Diagnosis Date  . Anxiety    no meds  . Depression    no meds  . Medical history non-contributory   . No pertinent past medical history     Patient Active Problem List   Diagnosis Date Noted  . Adjustment  disorder with emotional disturbance 06/02/2014    Past Surgical History:  Procedure Laterality Date  . TONSILLECTOMY    . WISDOM TOOTH EXTRACTION       OB History    Gravida  2   Para  2   Term  2   Preterm      AB      Living  2     SAB      TAB      Ectopic      Multiple      Live Births  2            Home Medications    Prior to Admission medications   Medication Sig Start Date End Date Taking? Authorizing Provider  acetaminophen (TYLENOL) 500 MG tablet Take 1,000 mg by mouth every 6 (six) hours as needed for mild pain.    [provider]  HYDROcodone-acetaminophen (NORCO/VICODIN) 5-325 MG tablet Take 1 tablet by mouth every 4 (four) hours as needed. Patient not taking: Reported on 08/10/2018 03/18/17   Glynn Octave, MD  medroxyPROGESTERone (DEPO-PROVERA) 150 MG/ML injection Inject 150 mg into the muscle every 3 (three) months.    [provider]  ondansetron (ZOFRAN) 4 MG tablet Take 1 tablet (4 mg total) by mouth every 6 (six) hours. Patient not taking: Reported on 11/09/2018 08/10/18   Wynetta Fines, MD    Family  History Family History  Problem Relation Age of Onset  . Heart disease Maternal Grandmother   . Hypertension Maternal Grandmother   . Diabetes Maternal Grandmother   . Other Neg Hx     Social History Social History   Tobacco Use  . Smoking status: Never Smoker  . Smokeless tobacco: Never Used  Substance Use Topics  . Alcohol use: Yes    Comment: occ beer and wine  . Drug use: No     Allergies   Peanuts [peanut oil]   Review of Systems Review of Systems  Constitutional: Negative for chills and fever.  HENT: Negative for congestion and rhinorrhea.   Eyes: Negative for redness and visual disturbance.  Respiratory: Negative for shortness of breath and wheezing.   Cardiovascular: Negative for chest pain and palpitations.  Gastrointestinal: Negative for nausea and vomiting.  Genitourinary: Negative  for dysuria and urgency.  Musculoskeletal: Negative for arthralgias and myalgias.  Skin: Negative for pallor and wound.  Neurological: Positive for dizziness and headaches.     Physical Exam Updated Vital Signs BP 111/78   Pulse 60   Temp 98.1 F (36.7 C) (Oral)   Resp 15   Ht 5\' 4"  (1.626 m)   Wt 48.5 kg   SpO2 100%   BMI 18.37 kg/m   Physical Exam Vitals signs and nursing note reviewed.  Constitutional:      General: She is not in acute distress.    Appearance: She is well-developed. She is not diaphoretic.  HENT:     Head: Normocephalic and atraumatic.     Comments: Swollen turbinates, posterior nasal drip, left frontal sinus tenderness to percussion, effusion to bilateral TMs, no noted bulging erythema.  Eyes:     Pupils: Pupils are equal, round, and reactive to light.  Neck:     Musculoskeletal: Normal range of motion and neck supple.  Cardiovascular:     Rate and Rhythm: Normal rate and regular rhythm.     Heart sounds: No murmur. No friction rub. No gallop.   Pulmonary:     Effort: Pulmonary effort is normal.     Breath sounds: No wheezing or rales.  Abdominal:     General: There is no distension.     Palpations: Abdomen is soft.     Tenderness: There is no abdominal tenderness.  Musculoskeletal:        General: No tenderness.  Skin:    General: Skin is warm and dry.  Neurological:     Mental Status: She is alert and oriented to person, place, and time.     Cranial Nerves: Cranial nerves are intact.     Sensory: Sensation is intact.     Motor: Motor function is intact.     Coordination: Coordination is intact.     Gait: Gait is intact.  Psychiatric:        Behavior: Behavior normal.      ED Treatments / Results  Labs (all labs ordered are listed, but only abnormal results are displayed) Labs Reviewed - No data to display  EKG None  Radiology Mr Laqueta JeanBrain W And Wo Contrast  Result Date: 11/09/2018 CLINICAL DATA:  28 year old female with  headache and dizziness ongoing for weeks. Recent vomiting. EXAM: MRI HEAD WITHOUT AND WITH CONTRAST TECHNIQUE: Multiplanar, multiecho pulse sequences of the brain and surrounding structures were obtained without and with intravenous contrast. CONTRAST:  4 milliliters Gadavist COMPARISON:  Head CT without contrast 08/10/2018. FINDINGS: Brain: Normal cerebral volume. No restricted diffusion to suggest  acute infarction. No midline shift, mass effect, evidence of mass lesion, ventriculomegaly, extra-axial collection or acute intracranial hemorrhage. Cervicomedullary junction and pituitary are within normal limits. Widely scattered small, somewhat nodular areas of subcortical white matter T2 and FLAIR hyperintensity in both frontal and parietal lobes. There is some involvement of the posterior left temporal lobe. The periventricular white matter is relatively spared (the left periatrial white matter is affected). The corpus callosum is spared. No cortical involvement or encephalomalacia. No chronic cerebral blood products. The deep gray matter nuclei, brainstem and cerebellum are spared. No diffusion restricted or enhancing lesions. Artifactual vague increased T2 hyperintensity in the right hemisphere on series 7, image 8 (not correlated with coronal T2 images). No dural thickening. Vascular: Major intracranial vascular flow voids are preserved. The major dural venous sinuses are enhancing and appear to be patent. Skull and upper cervical spine: No abnormality in the visible cervical spine. Visualized bone marrow signal is within normal limits. Sinuses/Orbits: Orbits appear symmetric and within normal limits. Paranasal Visualized paranasal sinuses and mastoids are stable and well pneumatized. Other: Visible internal auditory structures appear normal. Scalp and face soft tissues appear negative. IMPRESSION: Moderately advanced abnormal cerebral white matter signal in a nonspecific configuration. This was not apparent on  the October head CT. Top differential considerations in this age group include Multiple Sclerosis, hereditary small vessel ischemia, and vasculitis. No acute infarct or abnormal enhancement. Electronically Signed   By: Odessa Fleming M.D.   On: 11/09/2018 11:20    Procedures Procedures (including critical care time)  Medications Ordered in ED Medications  prochlorperazine (COMPAZINE) injection 10 mg (10 mg Intravenous Given 11/09/18 0904)  diphenhydrAMINE (BENADRYL) injection 25 mg (25 mg Intravenous Given 11/09/18 0904)  dexamethasone (DECADRON) tablet 10 mg (10 mg Oral Given 11/09/18 0904)  gadobutrol (GADAVIST) 1 MMOL/ML injection 4 mL (4 mLs Intravenous Contrast Given 11/09/18 1107)     Initial Impression / Assessment and Plan / ED Course  I have reviewed the triage vital signs and the nursing notes.  Pertinent labs & imaging results that were available during my care of the patient were reviewed by me and considered in my medical decision making (see chart for details).     28 yo F with a chief complaint of a headache and dizziness.  Going on for the past 3 months.  Worsening over the past week or so.  Was seen in the ED about 4 days ago given a headache cocktail with improvement and discharged to follow-up with her neurologist.  Patient feels since then her headache is continued to get worse.  Has had worsening bouts of dizziness as well.  My exam with no neurologic deficit.  Able to ambulate without difficulty.  The patient had a MRI that was done in [place] that saw some nonspecific T2 flair hyperintensities scattered throughout the cerebral white matter this per the radiologist was nonspecific, she is awaiting a further outpatient work-up with her neurologist.  As the patient is having worsening symptoms I will discuss the case with neurology here.  I discussed the case with Dr. Otelia Limes, he recommended an MRI with and without contrast to further evaluate.  Dr. Otelia Limes independently evaluated  the MRI, felt no need for emergent workup, recommended outpatient follow up.   12:47 PM:  I have discussed the diagnosis/risks/treatment options with the patient and family and believe the pt to be eligible for discharge home to follow-up with PCP. We also discussed returning to the ED immediately if new or worsening  sx occur. We discussed the sx which are most concerning (e.g., sudden worsening pain, fever, inability to tolerate by mouth) that necessitate immediate return. Medications administered to the patient during their visit and any new prescriptions provided to the patient are listed below.  Medications given during this visit Medications  prochlorperazine (COMPAZINE) injection 10 mg (10 mg Intravenous Given 11/09/18 0904)  diphenhydrAMINE (BENADRYL) injection 25 mg (25 mg Intravenous Given 11/09/18 0904)  dexamethasone (DECADRON) tablet 10 mg (10 mg Oral Given 11/09/18 0904)  gadobutrol (GADAVIST) 1 MMOL/ML injection 4 mL (4 mLs Intravenous Contrast Given 11/09/18 1107)     The patient appears reasonably screen and/or stabilized for discharge and I doubt any other medical condition or other [facility] requiring further screening, evaluation, or treatment in the ED at this time prior to discharge.    Final Clinical Impressions(s) / ED Diagnoses   Final diagnoses:  Concussion with loss of consciousness of 30 minutes or less, subsequent encounter    ED Discharge Orders    None       Melene PlanFloyd, Cavion Faiola, DO 11/09/18 1247

## 2018-11-12 DIAGNOSIS — Z3042 Encounter for surveillance of injectable contraceptive: Secondary | ICD-10-CM | POA: Diagnosis not present

## 2018-11-12 DIAGNOSIS — Z681 Body mass index (BMI) 19 or less, adult: Secondary | ICD-10-CM | POA: Diagnosis not present

## 2018-11-30 ENCOUNTER — Encounter: Payer: Self-pay | Admitting: Diagnostic Neuroimaging

## 2018-12-01 ENCOUNTER — Telehealth: Payer: Self-pay | Admitting: *Deleted

## 2018-12-01 ENCOUNTER — Encounter

## 2018-12-01 ENCOUNTER — Ambulatory Visit: Payer: BLUE CROSS/BLUE SHIELD | Admitting: Diagnostic Neuroimaging

## 2018-12-01 NOTE — Telephone Encounter (Signed)
Patient was no show for new patient appointment today. 

## 2018-12-02 ENCOUNTER — Encounter: Payer: Self-pay | Admitting: Diagnostic Neuroimaging

## 2019-02-04 DIAGNOSIS — Z681 Body mass index (BMI) 19 or less, adult: Secondary | ICD-10-CM | POA: Diagnosis not present

## 2019-02-04 DIAGNOSIS — R350 Frequency of micturition: Secondary | ICD-10-CM | POA: Diagnosis not present

## 2019-02-04 DIAGNOSIS — Z3042 Encounter for surveillance of injectable contraceptive: Secondary | ICD-10-CM | POA: Diagnosis not present

## 2019-04-07 DIAGNOSIS — Z01419 Encounter for gynecological examination (general) (routine) without abnormal findings: Secondary | ICD-10-CM | POA: Diagnosis not present

## 2019-04-07 DIAGNOSIS — Z681 Body mass index (BMI) 19 or less, adult: Secondary | ICD-10-CM | POA: Diagnosis not present

## 2019-04-07 DIAGNOSIS — R350 Frequency of micturition: Secondary | ICD-10-CM | POA: Diagnosis not present

## 2019-04-07 DIAGNOSIS — Z124 Encounter for screening for malignant neoplasm of cervix: Secondary | ICD-10-CM | POA: Diagnosis not present

## 2019-04-07 DIAGNOSIS — Z113 Encounter for screening for infections with a predominantly sexual mode of transmission: Secondary | ICD-10-CM | POA: Diagnosis not present

## 2019-04-07 DIAGNOSIS — Z131 Encounter for screening for diabetes mellitus: Secondary | ICD-10-CM | POA: Diagnosis not present

## 2019-04-15 ENCOUNTER — Emergency Department (HOSPITAL_COMMUNITY)
Admission: EM | Admit: 2019-04-15 | Discharge: 2019-04-15 | Disposition: A | Discharge status: Home / Self Care | Payer: BC Managed Care – PPO | Attending: Emergency Medicine | Admitting: Emergency Medicine

## 2019-04-15 ENCOUNTER — Other Ambulatory Visit: Payer: Self-pay

## 2019-04-15 ENCOUNTER — Emergency Department (HOSPITAL_COMMUNITY): Payer: BC Managed Care – PPO

## 2019-04-15 ENCOUNTER — Encounter (HOSPITAL_COMMUNITY): Payer: Self-pay | Admitting: Emergency Medicine

## 2019-04-15 DIAGNOSIS — R05 Cough: Secondary | ICD-10-CM | POA: Diagnosis not present

## 2019-04-15 DIAGNOSIS — Z91010 Allergy to peanuts: Secondary | ICD-10-CM | POA: Diagnosis not present

## 2019-04-15 DIAGNOSIS — R070 Pain in throat: Secondary | ICD-10-CM | POA: Insufficient documentation

## 2019-04-15 DIAGNOSIS — J029 Acute pharyngitis, unspecified: Secondary | ICD-10-CM | POA: Diagnosis not present

## 2019-04-15 DIAGNOSIS — R079 Chest pain, unspecified: Secondary | ICD-10-CM | POA: Insufficient documentation

## 2019-04-15 MED ORDER — DEXAMETHASONE 4 MG PO TABS
10.0000 mg | ORAL_TABLET | Freq: Once | ORAL | Status: AC
  Administered 2019-04-15: 10 mg via ORAL
  Filled 2019-04-15: qty 3

## 2019-04-15 MED ORDER — LIDOCAINE VISCOUS HCL 2 % MT SOLN
15.0000 mL | Freq: Once | OROMUCOSAL | Status: AC
  Administered 2019-04-15: 15 mL via OROMUCOSAL
  Filled 2019-04-15: qty 15

## 2019-04-15 NOTE — ED Notes (Signed)
Patient verbalizes understanding of discharge instructions. Opportunity for questioning and answers were provided. Armband removed by staff, pt discharged from ED home via POV.  

## 2019-04-15 NOTE — ED Provider Notes (Signed)
Emergency Department Provider Note   I have reviewed the triage vital signs and the nursing notes.   HISTORY  Chief Complaint Chest Pain and Shortness of Breath   HPI Tiffany Woodard is a 28 y.o. female who presents the emerge department today with central and left-sided chest pain.  Patient is  is been going on for couple days it seemed to start on her left breast about a week and a half ago as a sharp pain but now is just central and upper throat.  She says pain in bilateral sides of her neck as well.  Patient states it feels like she is a sore throats in the past.  No fevers.  She does have a cough which does make her throat pain hurt worse.  She does have a productive cough.  No known coronavirus contacts.  No fevers.  No muscle aches.  No other associated or modifying symptoms.    Past Medical History:  Diagnosis Date  . Anxiety    no meds  . Depression    no meds  . Medical history non-contributory   . No pertinent past medical history     Patient Active Problem List   Diagnosis Date Noted  . Adjustment disorder with emotional disturbance 06/02/2014    Past Surgical History:  Procedure Laterality Date  . TONSILLECTOMY    . WISDOM TOOTH EXTRACTION      Current Outpatient Rx  . Order #: [id number] Class: Historical Med    Allergies Peanuts [peanut oil]  Family History  Problem Relation Age of Onset  . Heart disease Maternal Grandmother   . Hypertension Maternal Grandmother   . Diabetes Maternal Grandmother   . Cervical cancer Mother   . Breast cancer Mother   . Other Neg Hx     Social History Social History   Tobacco Use  . Smoking status: Never Smoker  . Smokeless tobacco: Never Used  Substance Use Topics  . Alcohol use: Yes    Comment: occ beer and wine  . Drug use: No    Review of Systems  All other systems negative except as documented in the HPI. All pertinent positives and negatives as reviewed in the HPI.  ____________________________________________   PHYSICAL EXAM:  VITAL SIGNS: ED Triage Vitals  Enc Vitals Group     BP 04/15/19 0328 (!) 136/91     Pulse Rate 04/15/19 0328 68     Resp 04/15/19 0328 16     Temp 04/15/19 0328 97.8 F (36.6 C)     Temp Source 04/15/19 0328 Oral     SpO2 04/15/19 0328 100 %     Weight 04/15/19 0334 116 lb (52.6 kg)     Height 04/15/19 0334 5\' 4"  (1.626 m)    Constitutional: Alert and oriented. Well appearing and in no acute distress. Eyes: Conjunctivae are normal. PERRL. EOMI. Head: Atraumatic. Nose: No congestion/rhinnorhea. Mouth/Throat: Mucous membranes are moist.  Oropharynx non-erythematous. Neck: No stridor.  No meningeal signs.   Cardiovascular: Normal rate, regular rhythm. Good peripheral circulation. Grossly normal heart sounds.   Respiratory: Normal respiratory effort.  No retractions. Lungs CTAB. Gastrointestinal: Soft and nontender. No distention.  Musculoskeletal: No lower extremity tenderness nor edema. No gross deformities of extremities. ttp mid chest. Neurologic:  Normal speech and language. No gross focal neurologic deficits are appreciated.  Skin:  Skin is warm, dry and intact. No rash noted.   ____________________________________________   LABS (all labs ordered are listed, but only abnormal results are  displayed)  Labs Reviewed - No data to display ____________________________________________  EKG   EKG Interpretation  Date/Time:  Friday April 15 2019 03:32:40 EDT Ventricular Rate:  57 PR Interval:  136 QRS Duration: 82 QT Interval:  398 QTC Calculation: 387 R Axis:   56 Text Interpretation:  Sinus bradycardia with sinus arrhythmia Otherwise normal ECG No significant change since last tracing Confirmed by Merrily Pew [phone]) on 04/15/2019 4:10:20 AM       ____________________________________________  RADIOLOGY  Dg Chest Portable 1 View  Result Date: 04/15/2019 CLINICAL DATA:  Cough and chest pain EXAM:  PORTABLE CHEST 1 VIEW COMPARISON:  02/20/2017 FINDINGS: The heart size and mediastinal contours are within normal limits. Both lungs are clear. The visualized skeletal structures are unremarkable. IMPRESSION: Negative chest Electronically Signed   By: Monte Fantasia M.D.   On: 04/15/2019 04:49    ____________________________________________   PROCEDURES  Procedure(s) performed:   Procedures   ____________________________________________   INITIAL IMPRESSION / ASSESSMENT AND PLAN / ED COURSE  Sore throat without evidence of cardiac/pulmonary issues. Symptomatic treatment. Low centor criteria, no indication for testing. Doesn't want tested for covid either.   Tiffany Woodard was evaluated in Emergency Department on 04/15/2019 for the symptoms described in the history of present illness. She was evaluated in the context of the global COVID-19 pandemic, which necessitated consideration that the patient might be at risk for infection with the SARS-CoV-2 virus that causes COVID-19. Institutional protocols and algorithms that pertain to the evaluation of patients at risk for COVID-19 are in a state of rapid change based on information released by regulatory bodies including the CDC and federal and state organizations. These policies and algorithms were followed during the patient's care in the ED.   Pertinent labs & imaging results that were available during my care of the patient were reviewed by me and considered in my medical decision making (see chart for details).  A medical screening exam was performed and I feel the patient has had an appropriate workup for their chief complaint at this time and likelihood of emergent condition existing is low. They have been counseled on decision, discharge, follow up and which symptoms necessitate immediate return to the emergency department. They or their family verbally stated understanding and agreement with plan and discharged in stable condition.    ____________________________________________  FINAL CLINICAL IMPRESSION(S) / ED DIAGNOSES  Final diagnoses:  Chest pain, unspecified type  Sore throat     MEDICATIONS GIVEN DURING THIS VISIT:  Medications  lidocaine (XYLOCAINE) 2 % viscous mouth solution 15 mL (15 mLs Mouth/Throat Given 04/15/19 0456)  dexamethasone (DECADRON) tablet 10 mg (10 mg Oral Given 04/15/19 0455)     NEW OUTPATIENT MEDICATIONS STARTED DURING THIS VISIT:  Discharge Medication List as of 04/15/2019  5:13 AM      Note:  This note was prepared with assistance of Dragon voice recognition software. Occasional wrong-word or sound-a-like substitutions may have occurred due to the inherent limitations of voice recognition software.   Yajahira Tison, Corene Cornea, MD 04/15/19 [phone]

## 2019-04-15 NOTE — ED Notes (Signed)
Pt complains of SOB and chest pain, under left breast, for the past week and a half.

## 2019-04-15 NOTE — ED Triage Notes (Signed)
Patient with chest pain and shortness of breath that started about a week and a half ago, getting worse tonight.  No fever or URI symptoms.  She does have a burning sensation in her throat, describes as sharp, dull.  She states that it is mostly under her left breast.

## 2019-04-29 DIAGNOSIS — Z3042 Encounter for surveillance of injectable contraceptive: Secondary | ICD-10-CM | POA: Diagnosis not present

## 2019-05-06 ENCOUNTER — Emergency Department (HOSPITAL_COMMUNITY): Payer: BC Managed Care – PPO

## 2019-05-06 ENCOUNTER — Other Ambulatory Visit: Payer: Self-pay

## 2019-05-06 ENCOUNTER — Emergency Department (HOSPITAL_COMMUNITY)
Admission: EM | Admit: 2019-05-06 | Discharge: 2019-05-07 | Disposition: A | Discharge status: Home / Self Care | Payer: BC Managed Care – PPO | Attending: Emergency Medicine | Admitting: Emergency Medicine

## 2019-05-06 DIAGNOSIS — Z79899 Other long term (current) drug therapy: Secondary | ICD-10-CM | POA: Diagnosis not present

## 2019-05-06 DIAGNOSIS — M79602 Pain in left arm: Secondary | ICD-10-CM | POA: Insufficient documentation

## 2019-05-06 DIAGNOSIS — M79622 Pain in left upper arm: Secondary | ICD-10-CM | POA: Diagnosis not present

## 2019-05-06 DIAGNOSIS — R079 Chest pain, unspecified: Secondary | ICD-10-CM | POA: Diagnosis not present

## 2019-05-06 DIAGNOSIS — R0789 Other chest pain: Secondary | ICD-10-CM | POA: Insufficient documentation

## 2019-05-06 LAB — BASIC METABOLIC PANEL
Anion gap: 8 (ref 5–15)
BUN: 13 mg/dL (ref 6–20)
CO2: 19 mmol/L — ABNORMAL LOW (ref 22–32)
Calcium: 9.4 mg/dL (ref 8.9–10.3)
Chloride: 111 mmol/L (ref 98–111)
Creatinine, Ser: 0.86 mg/dL (ref 0.44–1.00)
GFR calc Af Amer: >60 mL/min (ref >60)
GFR calc non Af Amer: >60 mL/min (ref >60)
Glucose, Bld: 98 mg/dL (ref 70–99)
Potassium: 3.8 mmol/L (ref 3.5–5.1)
Sodium: 138 mmol/L (ref 135–145)

## 2019-05-06 LAB — CBC
HCT: 38.9 % (ref 36.0–46.0)
Hemoglobin: 12.8 g/dL (ref 12.0–15.0)
MCH: 31 pg (ref 26.0–34.0)
MCHC: 32.9 g/dL (ref 30.0–36.0)
MCV: 94.2 fL (ref 80.0–100.0)
Platelets: 215 10*3/uL (ref 150–400)
RBC: 4.13 MIL/uL (ref 3.87–5.11)
RDW: 12.6 % (ref 11.5–15.5)
WBC: 7.3 10*3/uL (ref 4.0–10.5)
nRBC: 0 % (ref 0.0–0.2)

## 2019-05-06 LAB — I-STAT BETA HCG BLOOD, ED (MC, WL, AP ONLY): I-stat hCG, quantitative: <5 m[IU]/mL (ref <5)

## 2019-05-06 LAB — TROPONIN I (HIGH SENSITIVITY): Troponin I (High Sensitivity): <2 ng/L (ref <18)

## 2019-05-06 MED ORDER — SODIUM CHLORIDE 0.9% FLUSH: 3.0000 mL | Freq: Once | INTRAVENOUS | Status: DC

## 2019-05-06 NOTE — ED Triage Notes (Signed)
Per pt she has been having chest pain that is in the upper gastric area that Has been going on for 1 week. Pt says her left arm feels like it is swelling. Pt did vomited yesterday and today.

## 2019-05-07 ENCOUNTER — Emergency Department (HOSPITAL_COMMUNITY): Payer: BC Managed Care – PPO

## 2019-05-07 DIAGNOSIS — M79622 Pain in left upper arm: Secondary | ICD-10-CM | POA: Diagnosis not present

## 2019-05-07 DIAGNOSIS — R079 Chest pain, unspecified: Secondary | ICD-10-CM | POA: Diagnosis not present

## 2019-05-07 LAB — TROPONIN I (HIGH SENSITIVITY): Troponin I (High Sensitivity): <2 ng/L (ref <18)

## 2019-05-07 MED ORDER — IOHEXOL 350 MG/ML SOLN
100.0000 mL | Freq: Once | INTRAVENOUS | Status: AC | PRN
  Administered 2019-05-07: 100 mL via INTRAVENOUS

## 2019-05-07 MED ORDER — OMEPRAZOLE 20 MG PO CPDR: 20.0000 mg | DELAYED_RELEASE_CAPSULE | Freq: Every day | ORAL | 0 refills | Status: AC

## 2019-05-07 MED ORDER — HYDROCODONE-ACETAMINOPHEN 5-325 MG PO TABS
2.0000 | ORAL_TABLET | Freq: Once | ORAL | Status: AC
  Administered 2019-05-07: 2 via ORAL
  Filled 2019-05-07: qty 2

## 2019-05-07 NOTE — ED Notes (Signed)
Patient verbalized understanding of dc instructions, vss, ambulatory with nad.   

## 2019-05-07 NOTE — Discharge Instructions (Addendum)
1. Medications: Omeprazole, usual home medications 2. Treatment: rest, drink plenty of fluids, exercise, continue to reduce stress 3. Follow Up: Establish care with PCP and follow-up with her for ongoing care.  Please consider contacting a counselor for ongoing stress management; Please return to the ER for new or worsening chest pain, SOB, fevers, arm swelling or other concerns.

## 2019-05-07 NOTE — ED Provider Notes (Signed)
[facility]   CSN: [id number] Arrival date & time: 05/06/19  2129    History   Chief Complaint Chief Complaint  Patient presents with   Chest Pain    HPI Tiffany Woodard is a 28 y.o. female with a hx of anxiety, depression presents to the Emergency Department complaining of gradual, intermittent left chest pain onset 1 week sgo. Associated symptoms include constant upper left arm pain, worse with movement and palpation.  Patient reports her mother states her left arm is swollen however patient does not think so.  Inspiration makes the chest pain significantly worse. Pt reports 2 episodes of NBNB emesis yesterday; no emesis today. Pt reports normal breast exam and no breast pain. Denies rashes/open wounds, known injury.  Pt denies fever, chills, headache, neck pain, abd pain, weakness, dizziness, syncope, cough, congestion.  Pt denies new activities, lifting or pulling.  Pt denies smoking, drinking alcohol or drugs.  Pt reports she has had similar chest pain in the past, but never with associated arm pain.  She reports family hx of polycythemia and concern for blood clot.  Pt denies OCP usage (but is on depo-provera), hx of blood clots, recent travel, periods of immobilization, leg swelling, lupus or personal history of cancer.  HPI: A 28 year old patient presents for evaluation of chest pain. Initial onset of pain was more than 6 hours ago. The patient's chest pain is not worse with exertion. The patient's chest pain is middle- or left-sided, is not well-localized, is not described as heaviness/pressure/tightness, is not sharp and does not radiate to the arms/jaw/neck. The patient does not complain of nausea and denies diaphoresis. The patient has no history of stroke, has no history of peripheral artery disease, has not smoked in the past 90 days, denies any history of treated diabetes, has no relevant family history of coronary artery  disease (first degree relative at less than age 27), is not hypertensive, has no history of hypercholesterolemia and does not have an elevated BMI (>=30).   The history is provided by the patient and medical records. No language interpreter was used.    Past Medical History:  Diagnosis Date   Anxiety    no meds   Depression    no meds   Medical history non-contributory    No pertinent past medical history     Patient Active Problem List   Diagnosis Date Noted   Adjustment disorder with emotional disturbance 06/02/2014    Past Surgical History:  Procedure Laterality Date   TONSILLECTOMY     WISDOM TOOTH EXTRACTION       OB History    Gravida  2   Para  2   Term  2   Preterm      AB      Living  2     SAB      TAB      Ectopic      Multiple      Live Births  2            Home Medications    Prior to Admission medications   Medication Sig Start Date End Date Taking? Authorizing Provider  hydrocortisone cream 0.5 % Apply 1 application topically 2 (two) times daily as needed for itching.   Yes [provider]  ibuprofen (ADVIL) 200 MG tablet Take 400 mg by mouth every 6 (six) hours as needed for moderate pain.   Yes [provider]  medroxyPROGESTERone (  DEPO-PROVERA) 150 MG/ML injection Inject 150 mg into the muscle every 3 (three) months.   Yes [provider]  omeprazole (PRILOSEC) 20 MG capsule Take 1 capsule (20 mg total) by mouth daily. 05/07/19   Tajon Moring, Dahlia ClientHannah, PA-C    Family History Family History  Problem Relation Age of Onset   Heart disease Maternal Grandmother    Hypertension Maternal Grandmother    Diabetes Maternal Grandmother    Cervical cancer Mother    Breast cancer Mother    Other Neg Hx     Social History Social History   Tobacco Use   Smoking status: Never Smoker   Smokeless tobacco: Never Used  Substance Use Topics   Alcohol use: Yes    Comment: occ beer and wine    Drug use: No     Allergies   Peanuts [peanut oil]   Review of Systems Review of Systems  Constitutional: Negative for appetite change, diaphoresis, fatigue, fever and unexpected weight change.  HENT: Negative for mouth sores.   Eyes: Negative for visual disturbance.  Respiratory: Negative for cough, chest tightness, shortness of breath and wheezing.   Cardiovascular: Positive for chest pain.  Gastrointestinal: Negative for abdominal pain, constipation, diarrhea, nausea and vomiting.  Endocrine: Negative for polydipsia, polyphagia and polyuria.  Genitourinary: Negative for dysuria, frequency, hematuria and urgency.  Musculoskeletal: Positive for arthralgias and myalgias. Negative for back pain and neck stiffness.  Skin: Negative for rash.  Allergic/Immunologic: Negative for immunocompromised state.  Neurological: Negative for syncope, light-headedness and headaches.  Hematological: Does not bruise/bleed easily.  Psychiatric/Behavioral: Negative for sleep disturbance. The patient is not nervous/anxious.      Physical Exam Updated Vital Signs BP (!) 152/86 (BP Location: Right Arm)    Pulse 66    Temp 98.6 F (37 C) (Oral)    Resp 20    SpO2 100%   Physical Exam Vitals signs and nursing note reviewed.  Constitutional:      General: She is not in acute distress.    Appearance: She is not diaphoretic.  HENT:     Head: Normocephalic.  Eyes:     General: No scleral icterus.    Conjunctiva/sclera: Conjunctivae normal.  Neck:     Musculoskeletal: Normal range of motion.  Cardiovascular:     Rate and Rhythm: Normal rate and regular rhythm.     Pulses: Normal pulses.          Radial pulses are 2+ on the right side and 2+ on the left side.     Heart sounds: Normal heart sounds.  Pulmonary:     Effort: Pulmonary effort is normal. No tachypnea, accessory muscle usage, prolonged expiration, respiratory distress or retractions.     Breath sounds: Normal breath sounds. No stridor.      Comments: Equal chest rise. No increased work of breathing. Abdominal:     General: There is no distension.     Palpations: Abdomen is soft.     Tenderness: There is no abdominal tenderness. There is no guarding or rebound.  Musculoskeletal:     Left upper arm: She exhibits tenderness. She exhibits no swelling, no edema, no deformity and no laceration.     Comments: Moves all extremities equally and without difficulty. Full range of motion of the left shoulder, elbow and wrist.  Tenderness throughout the entire left humerus. No open wounds, ecchymosis, deformity or swelling.  Skin:    General: Skin is warm and dry.     Capillary Refill: Capillary refill takes  less than 2 seconds.  Neurological:     Mental Status: She is alert.     GCS: GCS eye subscore is 4. GCS verbal subscore is 5. GCS motor subscore is 6.     Comments: Speech is clear and goal oriented. Sensation intact to the left upper extremity and strength 5/5.  Psychiatric:        Mood and Affect: Mood normal.      ED Treatments / Results  Labs (all labs ordered are listed, but only abnormal results are displayed) Labs Reviewed  BASIC METABOLIC PANEL - Abnormal; Notable for the following components:      Result Value   CO2 19 (*)    All other components within normal limits  CBC  I-STAT BETA HCG BLOOD, ED ([facility], [facility], [facility] ONLY)  TROPONIN I (HIGH SENSITIVITY)  TROPONIN I (HIGH SENSITIVITY)    EKG EKG Interpretation  Date/Time:  Friday May 06 2019 21:50:20 EDT Ventricular Rate:  56 PR Interval:  136 QRS Duration: 66 QT Interval:  408 QTC Calculation: 393 R Axis:   59 Text Interpretation:  Sinus bradycardia Otherwise normal ECG When compared with ECG of 04/15/2019, No significant change was found Confirmed by Dione BoozeGlick, David ([id number]) on 05/06/2019 11:30:03 PM   Radiology Dg Chest 2 View  Result Date: 05/06/2019 CLINICAL DATA:  Left arm swelling, pain.  Left chest pain. EXAM: CHEST - 2 VIEW COMPARISON:  02/20/2017  FINDINGS: Heart and mediastinal contours are within normal limits. No focal opacities or effusions. No acute bony abnormality. IMPRESSION: No active cardiopulmonary disease. Electronically Signed   By: Charlett NoseKevin  Dover M.D.   On: 05/06/2019 22:07   Ct Angio Chest Pe W And/or Wo Contrast  Result Date: 05/07/2019 CLINICAL DATA:  28 year old female with chest pain. Concern for pulmonary embolism. EXAM: CT ANGIOGRAPHY CHEST WITH CONTRAST TECHNIQUE: Multidetector CT imaging of the chest was performed using the standard protocol during bolus administration of intravenous contrast. Multiplanar CT image reconstructions and MIPs were obtained to evaluate the vascular anatomy. CONTRAST:  100mL OMNIPAQUE IOHEXOL 350 MG/ML SOLN COMPARISON:  Chest radiograph dated 05/06/2019 FINDINGS: Cardiovascular: There is no cardiomegaly or pericardial effusion. There is retrograde flow of some contrast from the right atrium into the IVC suggestive of a degree of right heart dysfunction. The thoracic aorta is grossly unremarkable for the degree of opacification. No pulmonary artery embolus identified. Mediastinum/Nodes: There is no hilar or mediastinal adenopathy. The esophagus is grossly unremarkable. No mediastinal fluid collection. Residual thymic tissue noted in the anterior mediastinum. Lungs/Pleura: The lungs are clear. There is no pleural effusion or pneumothorax. The central airways are patent. Upper Abdomen: No acute abnormality. Musculoskeletal: No chest wall abnormality. No acute or significant osseous findings. Review of the MIP images confirms the above findings. IMPRESSION: No acute intrathoracic pathology. No CT evidence of pulmonary embolism. Electronically Signed   By: Elgie CollardArash  Radparvar M.D.   On: 05/07/2019 02:37   Dg Humerus Left  Result Date: 05/07/2019 CLINICAL DATA:  Initial evaluation for acute pain, possible small Ling. EXAM: LEFT HUMERUS - 2+ VIEW COMPARISON:  None. FINDINGS: And elbow are unremarkable. Osseous  mineralization normal. No visible soft tissue abnormality. Visualized left hemithorax clear. Fracture dislocation. Limited views of the shoulder IMPRESSION: No acute abnormality about the left humerus. Electronically Signed   By: Rise MuBenjamin  McClintock M.D.   On: 05/07/2019 01:20    Procedures Procedures (including critical care time)  Medications Ordered in ED Medications  sodium chloride flush (NS) 0.9 % injection 3 mL (  has no administration in time range)  HYDROcodone-acetaminophen (NORCO/VICODIN) 5-325 MG per tablet 2 tablet (2 tablets Oral Given 05/07/19 0051)  iohexol (OMNIPAQUE) 350 MG/ML injection 100 mL (100 mLs Intravenous Contrast Given 05/07/19 9702)     Initial Impression / Assessment and Plan / ED Course  I have reviewed the triage vital signs and the nursing notes.  Pertinent labs & imaging results that were available during my care of the patient were reviewed by me and considered in my medical decision making (see chart for details).  Clinical Course as of May 06 406  Sat May 07, 2019  0041 No hx of same  BP(!): 152/86 [HM]    Clinical Course User Index [HM] Ellerie Arenz, Gwenlyn Perking    [facility] Score: 0  Patient presents emergency department with left-sided chest pain and left arm pain.  Pain is worse with inspiration.  She is not tachycardic and is low risk for pulmonary embolism however patient is very concerned about this.  CT angiogram of her chest is without evidence of pulmonary embolism.  CT does note some retrograde flow of contrast from the right atrium into the IVC suggestive of a degree of right heart function.  Patient is without dyspnea on exertion, peripheral edema of her upper or lower extremities, shortness of breath.  Chest x-ray is without evidence of vascular congestion.  No evidence of cardiomegaly.  No murmur on cardiac exam.  This patient has had recurrent chest pain, will send her for cardiology follow-up for further evaluation and stress testing.   Less likely to be acute coronary syndrome at this time.  Heart score is 0 and troponin is negative.  EKG was sinus bradycardia but without ischemia.  Left upper extremity is without swelling.  Less likely to be DVT.  Plain films are without bony abnormality including bony tumors.  Soft tissues are clinically unremarkable.  Highly doubt necrotizing fasciitis or other infection.  No open wounds.  Discussed clinical, laboratory and radiographic work-up with the patient.  She does admit to increased stress and anxiety over the last several months.  She is working many hours at home and concerned about her children not returning to school.  We will give omeprazole for potential gastritis/GERD/reflux symptoms.  Additionally have referred patient to primary care for ongoing work-up and evaluation.  She has been referred to cardiology for further cardiac work-up.  I have suggested outpatient counseling.  Patient does not have an emergent psychiatric condition.  Patient states understanding and is in agreement with this plan.  Final Clinical Impressions(s) / ED Diagnoses   Final diagnoses:  Nonspecific chest pain  Left arm pain    ED Discharge Orders         Ordered    omeprazole (PRILOSEC) 20 MG capsule  Daily     05/07/19 0356           Jarick Harkins, Jarrett Soho, PA-C 05/07/19 0410    [name], Delice Bison, DO 05/07/19 [phone]

## 2019-05-11 ENCOUNTER — Emergency Department (HOSPITAL_COMMUNITY)
Admission: EM | Admit: 2019-05-11 | Discharge: 2019-05-11 | Disposition: A | Discharge status: Home / Self Care | Payer: BC Managed Care – PPO | Attending: Emergency Medicine | Admitting: Emergency Medicine

## 2019-05-11 ENCOUNTER — Other Ambulatory Visit: Payer: Self-pay

## 2019-05-11 ENCOUNTER — Ambulatory Visit (HOSPITAL_BASED_OUTPATIENT_CLINIC_OR_DEPARTMENT_OTHER): Payer: BC Managed Care – PPO

## 2019-05-11 ENCOUNTER — Encounter (HOSPITAL_COMMUNITY): Payer: Self-pay | Admitting: *Deleted

## 2019-05-11 DIAGNOSIS — R52 Pain, unspecified: Secondary | ICD-10-CM

## 2019-05-11 DIAGNOSIS — M79602 Pain in left arm: Secondary | ICD-10-CM | POA: Insufficient documentation

## 2019-05-11 MED ORDER — NAPROXEN 250 MG PO TABS
500.0000 mg | ORAL_TABLET | Freq: Once | ORAL | Status: AC
  Administered 2019-05-11: 11:00:00 500 mg via ORAL
  Filled 2019-05-11: qty 2

## 2019-05-11 MED ORDER — NAPROXEN 500 MG PO TABS: 500.0000 mg | ORAL_TABLET | Freq: Two times a day (BID) | ORAL | 0 refills | Status: AC

## 2019-05-11 NOTE — ED Triage Notes (Signed)
PT seen last week for same . Pt reports arm pain worse with swelling  Pt takes  Depo for birth control. Pt also reports her Mother has had Blood clots.  Pt does not smoke.

## 2019-05-11 NOTE — ED Notes (Signed)
Discharge instructions and prescriptions discussed with Pt. Pt verbalized understanding. Pt stable and ambulatory.   

## 2019-05-11 NOTE — ED Provider Notes (Signed)
[facility]   CSN: [id number] Arrival date & time: 05/11/19  [id number]    History   Chief Complaint Chief Complaint  Patient presents with  . Arm Pain    LT    HPI Evonnie PatLondon M Inga is a 28 y.o. female.     28 y.o female with a PMH of Anxiety, Depression presents to the ED with a chief complaint of left arm pain x 2 weeks. Patient was seen in the ED 4 days ago, had a CT angios to rule out any pulmonary embolism, had a left humerus x-ray which was within normal limits.  Reports she felt like her arm was getting more swollen along with having more pain.  She reports this is an intermittent left arm ache which is worse without Motrin.  She has been taking Motrin regularly but reports this only helps for a couple hours and pain returns.  She states her mother has a history of blood clots, shows concern for this at this time.  She denies any trauma, oral contraceptive use, she is currently on Depo-Provera.  No recent surgeries, immobilizations, no previous history of CA.  Denies any chest pain, shortness of breath.  The history is provided by the patient and medical records.  Arm Pain    Past Medical History:  Diagnosis Date  . Anxiety    no meds  . Depression    no meds  . Medical history non-contributory   . No pertinent past medical history     Patient Active Problem List   Diagnosis Date Noted  . Adjustment disorder with emotional disturbance 06/02/2014    Past Surgical History:  Procedure Laterality Date  . TONSILLECTOMY    . WISDOM TOOTH EXTRACTION       OB History    Gravida  2   Para  2   Term  2   Preterm      AB      Living  2     SAB      TAB      Ectopic      Multiple      Live Births  2            Home Medications    Prior to Admission medications   Medication Sig Start Date End Date Taking? Authorizing Provider  hydrocortisone cream 0.5 % Apply 1 application topically 2 (two)  times daily as needed for itching.    [provider]  ibuprofen (ADVIL) 200 MG tablet Take 400 mg by mouth every 6 (six) hours as needed for moderate pain.    [provider]  medroxyPROGESTERone (DEPO-PROVERA) 150 MG/ML injection Inject 150 mg into the muscle every 3 (three) months.    [provider]  omeprazole (PRILOSEC) 20 MG capsule Take 1 capsule (20 mg total) by mouth daily. 05/07/19   Muthersbaugh, Dahlia ClientHannah, PA-C    Family History Family History  Problem Relation Age of Onset  . Heart disease Maternal Grandmother   . Hypertension Maternal Grandmother   . Diabetes Maternal Grandmother   . Cervical cancer Mother   . Breast cancer Mother   . Other Neg Hx     Social History Social History   Tobacco Use  . Smoking status: Never Smoker  . Smokeless tobacco: Never Used  Substance Use Topics  . Alcohol use: Yes    Comment: occ beer and wine  . Drug use: No     Allergies  Peanuts [peanut oil]   Review of Systems Review of Systems  Constitutional: Negative for fever.  Musculoskeletal: Positive for myalgias. Negative for joint swelling.  Skin: Negative for color change, pallor, rash and wound.     Physical Exam Updated Vital Signs BP 112/72 (BP Location: Right Arm)   Pulse 64   Temp 98.8 F (37.1 C) (Oral)   Resp 20   Ht 5\' 4"  (1.626 m)   Wt 50.8 kg   SpO2 100%   BMI 19.22 kg/m   Physical Exam Vitals signs and nursing note reviewed.  Constitutional:      General: She is not in acute distress.    Appearance: She is well-developed.     Comments: Non-ill-appearing.  HENT:     Head: Normocephalic and atraumatic.     Mouth/Throat:     Pharynx: No oropharyngeal exudate.  Eyes:     Pupils: Pupils are equal, round, and reactive to light.  Neck:     Musculoskeletal: Normal range of motion.  Cardiovascular:     Rate and Rhythm: Regular rhythm.     Heart sounds: Normal heart sounds.  Pulmonary:     Effort: Pulmonary effort is  normal. No respiratory distress.     Breath sounds: Normal breath sounds.  Abdominal:     General: Bowel sounds are normal. There is no distension.     Palpations: Abdomen is soft.     Tenderness: There is no abdominal tenderness.  Musculoskeletal:        General: No tenderness or deformity.     Left elbow: She exhibits no swelling, no effusion, no deformity and no laceration.       Arms:     Right lower leg: No edema.     Left lower leg: No edema.     Comments: No swelling noted on my exam.  No changes in the skin, no streaking.  Skin:    General: Skin is warm and dry.  Neurological:     Mental Status: She is alert and oriented to person, place, and time.  Psychiatric:        Mood and Affect: Mood is anxious.      ED Treatments / Results  Labs (all labs ordered are listed, but only abnormal results are displayed) Labs Reviewed - No data to display  EKG None  Radiology No results found.  Procedures Procedures (including critical care time)  Medications Ordered in ED Medications  naproxen (NAPROSYN) tablet 500 mg (has no administration in time range)     Initial Impression / Assessment and Plan / ED Course  I have reviewed the triage vital signs and the nursing notes.  Pertinent labs & imaging results that were available during my care of the patient were reviewed by me and considered in my medical decision making (see chart for details).    Patient with no past medical history presents to the ED with complaints of left arm pain which began 2 weeks ago.  Seen in the ED on 05-07-2019, had a CT angios chest which was negative for pulmonary embolism.  She had a left humerus x-ray which was negative for any fracture, acute process.  She reports she felt her arm increase in swelling, reports her mother wanted her to get checked out as mother does have a previous history of blood clots.  There is a family history of polycythemia and concern for blood clots.  During my exam  there is no swelling to the left arm, does report  pain along the biceps area.  There is no open wounds, low suspicion for any cellulitis, neck fashion.  Denies any chest pain on today's visit, has no shortness of breath.  Had a reassuring cardiac work-up 4 days ago.  Will obtain ultrasound upper extremity to rule out any DVT as patient shows concern for this.    Patient given naproxen to help with her pain, she reports taking Motrin without improvement after it wearing off.  Vital signs are unremarkable, no hypoxia, tachycardia.  She is PERC negative.  Patient does seem a bit anxious, repeats mother would really like her to get her left arm checked out, ultrasound upper extremity pending.   Ultrasound venous of the upper extremity showed no DVT.  These results were discussed with patient.  She did report she was doing a hand stand when all the symptoms began.  Will prescribe patient a short course of NSAIDs to help with her symptoms.  Rice therapy encouraged.  Return precautions discussed at length.  Portions of this note were generated with Lobbyist. Dictation errors may occur despite best attempts at proofreading.    Final Clinical Impressions(s) / ED Diagnoses   Final diagnoses:  Left arm pain    ED Discharge Orders    None       Janeece Fitting, PA-C 05/11/19 1210    Tegeler, Gwenyth Allegra, MD 05/11/19 1600

## 2019-05-11 NOTE — Discharge Instructions (Addendum)
Your ultrasound today was negative for blood clots. I have prescribed anti inflammatory to help with your pain, you may apply ice or heat to the area.  Please follow up with your primary care as needed.

## 2019-05-11 NOTE — Progress Notes (Signed)
Left upper ext venous  has been completed. Refer to [facility] under chart review to view preliminary results.   05/11/2019  12:04 PM Anushree Dorsi, Bonnye Fava  ;

## 2019-05-25 ENCOUNTER — Ambulatory Visit (INDEPENDENT_AMBULATORY_CARE_PROVIDER_SITE_OTHER): Payer: BC Managed Care – PPO | Admitting: Cardiology

## 2019-05-25 ENCOUNTER — Other Ambulatory Visit: Payer: Self-pay

## 2019-05-25 ENCOUNTER — Encounter: Payer: Self-pay | Admitting: Cardiology

## 2019-05-25 VITALS — BP 98/66 | HR 52 | Ht 64.0 in | Wt 114.1 lb

## 2019-05-25 DIAGNOSIS — R0789 Other chest pain: Secondary | ICD-10-CM

## 2019-05-25 DIAGNOSIS — R011 Cardiac murmur, unspecified: Secondary | ICD-10-CM | POA: Diagnosis not present

## 2019-05-25 NOTE — Patient Instructions (Signed)
Medication Instructions:  Your physician recommends that you continue on your current medications as directed. Please refer to the Current Medication list given to you today.  If you need a refill on your cardiac medications before your next appointment, please call your pharmacy.   Lab work: NONE If you have labs (blood work) drawn today and your tests are completely normal, you will receive your results only by: . MyChart Message (if you have MyChart) OR . A paper copy in the mail If you have any lab test that is abnormal or we need to change your treatment, we will call you to review the results.  Testing/Procedures: Your physician has requested that you have an echocardiogram. Echocardiography is a painless test that uses sound waves to create images of your heart. It provides your doctor with information about the size and shape of your heart and how well your heart's chambers and valves are working. This procedure takes approximately one hour. There are no restrictions for this procedure.    Follow-Up: At [facility], you and your health needs are our priority.  As part of our continuing mission to provide you with exceptional heart care, we have created designated Provider Care Teams.  These Care Teams include your primary Cardiologist (physician) and Advanced Practice Providers (APPs -  Physician Assistants and Nurse Practitioners) who all work together to provide you with the care you need, when you need it. You will need a follow up appointment in 6 months.    Any Other Special Instructions Will Be Listed Below  Echocardiogram An echocardiogram is a procedure that uses painless sound waves (ultrasound) to produce an image of the heart. Images from an echocardiogram can provide important information about:  Signs of coronary artery disease (CAD).  Aneurysm detection. An aneurysm is a weak or damaged part of an artery wall that bulges out from the normal force of blood pumping  through the body.  Heart size and shape. Changes in the size or shape of the heart can be associated with certain conditions, including heart failure, aneurysm, and CAD.  Heart muscle function.  Heart valve function.  Signs of a past heart attack.  Fluid buildup around the heart.  Thickening of the heart muscle.  A tumor or infectious growth around the heart valves. Tell a health care provider about:  Any allergies you have.  All medicines you are taking, including vitamins, herbs, eye drops, creams, and over-the-counter medicines.  Any blood disorders you have.  Any surgeries you have had.  Any medical conditions you have.  Whether you are pregnant or may be pregnant. What are the risks? Generally, this is a safe procedure. However, problems may occur, including:  Allergic reaction to dye (contrast) that may be used during the procedure. What happens before the procedure? No specific preparation is needed. You may eat and drink normally. What happens during the procedure?   An IV tube may be inserted into one of your veins.  You may receive contrast through this tube. A contrast is an injection that improves the quality of the pictures from your heart.  A gel will be applied to your chest.  A wand-like tool (transducer) will be moved over your chest. The gel will help to transmit the sound waves from the transducer.  The sound waves will harmlessly bounce off of your heart to allow the heart images to be captured in real-time motion. The images will be recorded on a computer. The procedure may vary among health   care providers and hospitals. What happens after the procedure?  You may return to your normal, everyday life, including diet, activities, and medicines, unless your health care provider tells you not to do that. Summary  An echocardiogram is a procedure that uses painless sound waves (ultrasound) to produce an image of the heart.  Images from an  echocardiogram can provide important information about the size and shape of your heart, heart muscle function, heart valve function, and fluid buildup around your heart.  You do not need to do anything to prepare before this procedure. You may eat and drink normally.  After the echocardiogram is completed, you may return to your normal, everyday life, unless your health care provider tells you not to do that. This information is not intended to replace advice given to you by your health care provider. Make sure you discuss any questions you have with your health care provider. Document Released: 10/03/2000 Document Revised: 01/27/2019 Document Reviewed: 11/08/2016 Elsevier Patient Education  2020 Elsevier Inc.  

## 2019-05-25 NOTE — Addendum Note (Signed)
Addended by: Beckey Rutter on: 05/25/2019 09:56 AM   Modules accepted: Orders

## 2019-05-25 NOTE — Progress Notes (Signed)
Cardiology Office Note:    Date:  05/25/2019   ID:  Tiffany Woodard, DOB 04-14-1991, MRN [id number]  PCP:  Patient, [name]  Cardiologist:  Tiffany Brothersajan R Rashea Hoskie, MD   Referring MD: Tiffany Woodard    ASSESSMENT:    1. Chest discomfort    PLAN:    In order of problems listed above:  1. Chest discomfort: I discussed my findings with the patient at extensive length.  Her symptoms are atypical for coronary etiology and I agree with the emergency room evaluation.  Records were reviewed. 2. Echocardiogram will be done to assess murmur heard on auscultation 3.  patient needs to go to the nearest emergency room for any significant concerns and she is aware of this. 4. Patient will be seen in follow-up appointment in 6 months or earlier if the patient has any concerns    Medication Adjustments/Labs and Tests Ordered: Current medicines are reviewed at length with the patient today.  Concerns regarding medicines are outlined above.  No orders of the defined types were placed in this encounter.  No orders of the defined types were placed in this encounter.    History of Present Illness:    Tiffany Woodard is a 28 y.o. female who is being seen today for the evaluation of chest discomfort at the request of Tiffany Woodard.  Patient is a pleasant 28 year old female.  She denies any history of hypertension dyslipidemia or diabetes mellitus.  She is an active woman and takes good care of herself.  She is active with running and yoga.  She mentions to me that she occasionally has sharp stabbing pains which Woodard not occur on exertion.  No orthopnea or PND.  For this reason she wanted to get evaluated.  At the time of my evaluation, the patient is alert awake oriented and in no distress.  She is a non-smoker.  Past Medical History:  Diagnosis Date  . Anxiety    no meds  . Depression    no meds  . Medical history non-contributory   . No pertinent past medical history     Past  Surgical History:  Procedure Laterality Date  . TONSILLECTOMY    . WISDOM TOOTH EXTRACTION      Current Medications: Current Meds  Medication Sig  . hydrocortisone cream 0.5 % Apply 1 application topically 2 (two) times daily as needed for itching.  Marland Kitchen. ibuprofen (ADVIL) 200 MG tablet Take 400 mg by mouth every 6 (six) hours as needed for moderate pain.  . medroxyPROGESTERone (DEPO-PROVERA) 150 MG/ML injection Inject 150 mg into the muscle every 3 (three) months.  Marland Kitchen. omeprazole (PRILOSEC) 20 MG capsule Take 1 capsule (20 mg total) by mouth daily.     Allergies:   Peanuts [peanut oil]   Social History   Socioeconomic History  . Marital status: Single    Spouse name: Not on file  . Number of children: 2  . Years of education: Not on file  . Highest education level: Not on file  Occupational History    Comment: HR supervisor- [employer]  Social Needs  . Financial resource strain: Not on file  . Food insecurity    Worry: Not on file    Inability: Not on file  . Transportation needs    Medical: Not on file    Non-medical: Not on file  Tobacco Use  . Smoking status: Never Smoker  . Smokeless tobacco: Never Used  Substance and Sexual Activity  .  Alcohol use: Yes    Comment: occ beer and wine  . Drug use: No  . Sexual activity: Yes    Birth control/protection: Implant, Other-see comments    Comment: depo - inserted Jan 2019   Lifestyle  . Physical activity    Days per week: Not on file    Minutes per session: Not on file  . Stress: Not on file  Relationships  . Social Herbalist on phone: Not on file    Gets together: Not on file    Attends religious service: Not on file    Active member of club or organization: Not on file    Attends meetings of clubs or organizations: Not on file    Relationship status: Not on file  Other Topics Concern  . Not on file  Social History Narrative  . Not on file     Family History: The patient's family history includes Breast  cancer in her mother; Cervical cancer in her mother; Diabetes in her maternal grandmother; Heart disease in her maternal grandmother; Hypertension in her maternal grandmother. There is no history of Other.  ROS:   Please see the history of present illness.    All other systems reviewed and are negative.  EKGs/Labs/Other Studies Reviewed:    The following studies were reviewed today: EKG was within normal limits   Recent Labs: 05/06/2019: BUN 13; Creatinine, Ser 0.86; Hemoglobin 12.8; Platelets 215; Potassium 3.8; Sodium 138  Recent Lipid Panel No results found for: CHOL, TRIG, HDL, CHOLHDL, VLDL, LDLCALC, LDLDIRECT  Physical Exam:    VS:  BP 98/66   Pulse (!) 52   Ht 5\' 4"  (1.626 m)   Wt 114 lb 1.3 oz (51.7 kg)   SpO2 100%   BMI 19.58 kg/m     Wt Readings from Last 3 Encounters:  05/25/19 114 lb 1.3 oz (51.7 kg)  05/11/19 112 lb (50.8 kg)  04/15/19 116 lb (52.6 kg)     GEN: Patient is in no acute distress HEENT: Normal NECK: No JVD; No carotid bruits LYMPHATICS: No lymphadenopathy CARDIAC: S1 S2 regular, 2/6 systolic murmur at the apex. RESPIRATORY:  Clear to auscultation without rales, wheezing or rhonchi  ABDOMEN: Soft, non-tender, non-distended MUSCULOSKELETAL:  No edema; No deformity  SKIN: Warm and dry NEUROLOGIC:  Alert and oriented x 3 PSYCHIATRIC:  Normal affect    Signed, Tiffany Lindau, MD  05/25/2019 9:49 AM    [place]

## 2019-05-31 ENCOUNTER — Other Ambulatory Visit: Payer: Self-pay

## 2019-05-31 ENCOUNTER — Ambulatory Visit (HOSPITAL_BASED_OUTPATIENT_CLINIC_OR_DEPARTMENT_OTHER)
Admission: RE | Admit: 2019-05-31 | Discharge: 2019-05-31 | Disposition: A | Discharge status: Home / Self Care | Payer: BC Managed Care – PPO | Source: Ambulatory Visit | Attending: Cardiology | Admitting: Cardiology

## 2019-05-31 DIAGNOSIS — R011 Cardiac murmur, unspecified: Secondary | ICD-10-CM | POA: Diagnosis not present

## 2019-05-31 NOTE — Progress Notes (Signed)
  Echocardiogram 2D Echocardiogram has been performed.  Tiffany Woodard 05/31/2019, 8:58 AM

## 2019-06-01 ENCOUNTER — Telehealth: Payer: Self-pay | Admitting: Cardiology

## 2019-06-01 ENCOUNTER — Telehealth: Payer: Self-pay | Admitting: *Deleted

## 2019-06-01 NOTE — Telephone Encounter (Signed)
Telephone call to patient. Left message that echo was unremarkable and to call with any question

## 2019-06-01 NOTE — Telephone Encounter (Signed)
-----   Message from Jenean Lindau, MD sent at 05/31/2019  1:11 PM EDT ----- The results of the study is unremarkable. Please inform patient. I will discuss in detail at next appointment. Cc  primary care/referring physician Jenean Lindau, MD 05/31/2019 1:10 PM

## 2019-06-02 NOTE — Telephone Encounter (Signed)
Returned patient call. Informed of echo results. No further questions.

## 2019-06-18 DIAGNOSIS — R0602 Shortness of breath: Secondary | ICD-10-CM | POA: Diagnosis not present

## 2019-06-18 DIAGNOSIS — R110 Nausea: Secondary | ICD-10-CM | POA: Diagnosis not present

## 2019-06-18 DIAGNOSIS — R079 Chest pain, unspecified: Secondary | ICD-10-CM | POA: Diagnosis not present

## 2019-06-18 DIAGNOSIS — Z91010 Allergy to peanuts: Secondary | ICD-10-CM | POA: Diagnosis not present

## 2019-06-18 DIAGNOSIS — R001 Bradycardia, unspecified: Secondary | ICD-10-CM | POA: Diagnosis not present

## 2019-06-18 DIAGNOSIS — Z793 Long term (current) use of hormonal contraceptives: Secondary | ICD-10-CM | POA: Diagnosis not present

## 2019-06-18 DIAGNOSIS — R0789 Other chest pain: Secondary | ICD-10-CM | POA: Diagnosis not present

## 2019-07-22 DIAGNOSIS — Z3042 Encounter for surveillance of injectable contraceptive: Secondary | ICD-10-CM | POA: Diagnosis not present

## 2019-09-28 DIAGNOSIS — Z793 Long term (current) use of hormonal contraceptives: Secondary | ICD-10-CM | POA: Diagnosis not present

## 2019-09-28 DIAGNOSIS — R509 Fever, unspecified: Secondary | ICD-10-CM | POA: Diagnosis not present

## 2019-09-28 DIAGNOSIS — Z91010 Allergy to peanuts: Secondary | ICD-10-CM | POA: Diagnosis not present

## 2019-09-28 DIAGNOSIS — Z20828 Contact with and (suspected) exposure to other viral communicable diseases: Secondary | ICD-10-CM | POA: Diagnosis not present

## 2019-09-28 DIAGNOSIS — R05 Cough: Secondary | ICD-10-CM | POA: Diagnosis not present

## 2019-10-05 ENCOUNTER — Other Ambulatory Visit: Payer: Self-pay

## 2019-10-05 ENCOUNTER — Ambulatory Visit (INDEPENDENT_AMBULATORY_CARE_PROVIDER_SITE_OTHER): Payer: BC Managed Care – PPO | Admitting: Cardiology

## 2019-10-05 ENCOUNTER — Encounter: Payer: Self-pay | Admitting: Cardiology

## 2019-10-05 VITALS — BP 116/70 | HR 60 | Ht 64.0 in | Wt 122.0 lb

## 2019-10-05 DIAGNOSIS — R0789 Other chest pain: Secondary | ICD-10-CM

## 2019-10-05 DIAGNOSIS — R011 Cardiac murmur, unspecified: Secondary | ICD-10-CM | POA: Diagnosis not present

## 2019-10-05 NOTE — Progress Notes (Signed)
Cardiology Office Note:    Date:  10/05/2019   ID:  Tiffany Woodard, DOB [birth year], MRN [id number]  PCP:  Patient, [name]  Cardiologist:  Garwin Brothers, MD   Referring MD: No ref. provider found    ASSESSMENT:    1. Chest discomfort    PLAN:    In order of problems listed above:  1. Her chest discomfort has resolved and I reassured the patient about my findings today.  Echocardiogram report was discussed with her extensively and she is very reassured about them.  Importance of regular exercise stressed and she promises to comply.  Her blood pressure steady today. 2. For risk stratification I recommended lipid panel and she plans to come in next few days for the same. 3. Patient will be seen in follow-up appointment in 9 months or earlier if the patient has any concerns    Medication Adjustments/Labs and Tests Ordered: Current medicines are reviewed at length with the patient today.  Concerns regarding medicines are outlined above.  No orders of the defined types were placed in this encounter.  No orders of the defined types were placed in this encounter.    History of Present Illness:    Tiffany Woodard is a 28 y.o. female who is being seen today.  Patient went to the hospital with fever and symptoms and Covid testing was negative.  She denies any problems at this time and takes care of activities of daily living.  I reviewed hospital records.  EKG reveals sinus bradycardia nonspecific ST-T changes CT chest of the chest was unremarkable and the echocardiogram that we did last time was fine to.  Currently patient feels fine.  She is taking care of activities of daily living.  She is working for [employer] and therefore does not have much time to exercise because of her busy season.  At the time of my evaluation, the patient is alert awake oriented and in no distress. Past Medical History:  Diagnosis Date  . Anxiety    no meds  . Depression    no meds  . Medical  history non-contributory   . No pertinent past medical history     Past Surgical History:  Procedure Laterality Date  . TONSILLECTOMY    . WISDOM TOOTH EXTRACTION      Current Medications: Current Meds  Medication Sig  . hydrocortisone cream 0.5 % Apply 1 application topically 2 (two) times daily as needed for itching.  Marland Kitchen ibuprofen (ADVIL) 200 MG tablet Take 400 mg by mouth every 6 (six) hours as needed for moderate pain.  . medroxyPROGESTERone (DEPO-PROVERA) 150 MG/ML injection Inject 150 mg into the muscle every 3 (three) months.  Marland Kitchen omeprazole (PRILOSEC) 20 MG capsule Take 1 capsule (20 mg total) by mouth daily.     Allergies:   Peanuts [peanut oil]   Social History   Socioeconomic History  . Marital status: Single    Spouse name: Not on file  . Number of children: 2  . Years of education: Not on file  . Highest education level: Not on file  Occupational History    Comment: HR supervisor- [employer]  Tobacco Use  . Smoking status: Never Smoker  . Smokeless tobacco: Never Used  Substance and Sexual Activity  . Alcohol use: Yes    Comment: occ beer and wine  . Drug use: No  . Sexual activity: Yes    Birth control/protection: Implant, Other-see comments    Comment: depo - inserted  Jan 2019   Other Topics Concern  . Not on file  Social History Narrative  . Not on file   Social Determinants of Health   Financial Resource Strain:   . Difficulty of Paying Living Expenses: Not on file  Food Insecurity:   . Worried About Programme researcher, broadcasting/film/videounning Out of Food in the Last Year: Not on file  . Ran Out of Food in the Last Year: Not on file  Transportation Needs:   . Lack of Transportation (Medical): Not on file  . Lack of Transportation (Non-Medical): Not on file  Physical Activity:   . Days of Exercise per Week: Not on file  . Minutes of Exercise per Session: Not on file  Stress:   . Feeling of Stress : Not on file  Social Connections:   . Frequency of Communication with Friends and  Family: Not on file  . Frequency of Social Gatherings with Friends and Family: Not on file  . Attends Religious Services: Not on file  . Active Member of Clubs or Organizations: Not on file  . Attends BankerClub or Organization Meetings: Not on file  . Marital Status: Not on file     Family History: The patient's family history includes Breast cancer in her mother; Cervical cancer in her mother; Diabetes in her maternal grandmother; Heart disease in her maternal grandmother; Hypertension in her maternal grandmother. There is no history of Other.  ROS:   Please see the history of present illness.    All other systems reviewed and are negative.  EKGs/Labs/Other Studies Reviewed:    The following studies were reviewed today: CLINICAL DATA:  28 year old female with chest pain. Concern for pulmonary embolism.  EXAM: CT ANGIOGRAPHY CHEST WITH CONTRAST  TECHNIQUE: Multidetector CT imaging of the chest was performed using the standard protocol during bolus administration of intravenous contrast. Multiplanar CT image reconstructions and MIPs were obtained to evaluate the vascular anatomy.  CONTRAST:  100mL OMNIPAQUE IOHEXOL 350 MG/ML SOLN  COMPARISON:  Chest radiograph dated 05/06/2019  FINDINGS: Cardiovascular: There is no cardiomegaly or pericardial effusion. There is retrograde flow of some contrast from the right atrium into the IVC suggestive of a degree of right heart dysfunction. The thoracic aorta is grossly unremarkable for the degree of opacification. No pulmonary artery embolus identified.  Mediastinum/Nodes: There is no hilar or mediastinal adenopathy. The esophagus is grossly unremarkable. No mediastinal fluid collection. Residual thymic tissue noted in the anterior mediastinum.  Lungs/Pleura: The lungs are clear. There is no pleural effusion or pneumothorax. The central airways are patent.  Upper Abdomen: No acute abnormality.  Musculoskeletal: No chest wall  abnormality. No acute or significant osseous findings.  Review of the MIP images confirms the above findings.  IMPRESSION: No acute intrathoracic pathology. No CT evidence of pulmonary embolism.   Electronically Signed   By: Elgie CollardArash  Radparvar M.D.   On: 05/07/2019 02:37   IMPRESSIONS    1. The left ventricle has normal systolic function with an ejection fraction of 60-65%. The cavity size was normal. Left ventricular diastolic parameters were normal. No evidence of left ventricular regional wall motion abnormalities.  2. The right ventricle has normal systolic function. The cavity was normal. There is no increase in right ventricular wall thickness.  3. No evidence of mitral valve stenosis.  4. The aortic valve was not well visualized. No stenosis of the aortic valve.  5. The aorta is normal in size and structure.  6. The aortic root, ascending aorta and aortic arch are normal  in size and structure.    Recent Labs: 05/06/2019: BUN 13; Creatinine, Ser 0.86; Hemoglobin 12.8; Platelets 215; Potassium 3.8; Sodium 138  Recent Lipid Panel No results found for: CHOL, TRIG, HDL, CHOLHDL, VLDL, LDLCALC, LDLDIRECT  Physical Exam:    VS:  BP 116/70 (BP Location: Right Arm, Patient Position: Sitting, Cuff Size: Normal)   Pulse 60   Ht 5\' 4"  (1.626 m)   Wt 122 lb (55.3 kg)   SpO2 100%   BMI 20.94 kg/m     Wt Readings from Last 3 Encounters:  10/05/19 122 lb (55.3 kg)  05/25/19 114 lb 1.3 oz (51.7 kg)  05/11/19 112 lb (50.8 kg)     GEN: Patient is in no acute distress HEENT: Normal NECK: No JVD; No carotid bruits LYMPHATICS: No lymphadenopathy CARDIAC: S1 S2 regular, 2/6 systolic murmur at the apex. RESPIRATORY:  Clear to auscultation without rales, wheezing or rhonchi  ABDOMEN: Soft, non-tender, non-distended MUSCULOSKELETAL:  No edema; No deformity  SKIN: Warm and dry NEUROLOGIC:  Alert and oriented x 3 PSYCHIATRIC:  Normal affect    Signed, Jenean Lindau, MD    10/05/2019 10:08 AM    [place]

## 2019-10-05 NOTE — Addendum Note (Signed)
Addended by: Polly Cobia A on: 10/05/2019 10:16 AM   Modules accepted: Orders

## 2019-10-05 NOTE — Patient Instructions (Signed)
Medication Instructions:  Your physician recommends that you continue on your current medications as directed. Please refer to the Current Medication list given to you today.  *If you need a refill on your cardiac medications before your next appointment, please call your pharmacy*  Lab Work: Your physician recommends that you return forFASTING lab work in: [place]: LFT's, LIPIDS  If you have labs (blood work) drawn today and your tests are completely normal, you will receive your results only by: Marland Kitchen MyChart Message (if you have MyChart) OR . A paper copy in the mail If you have any lab test that is abnormal or we need to change your treatment, we will call you to review the results.  Testing/Procedures: None Ordered  Follow-Up: At [facility], you and your health needs are our priority.  As part of our continuing mission to provide you with exceptional heart care, we have created designated Provider Care Teams.  These Care Teams include your primary Cardiologist (physician) and Advanced Practice Providers (APPs -  Physician Assistants and Nurse Practitioners) who all work together to provide you with the care you need, when you need it.  Your next appointment:   9 month(s)  The format for your next appointment:   In Person  Provider:   Jyl Heinz, MD

## 2019-10-11 DIAGNOSIS — Z3042 Encounter for surveillance of injectable contraceptive: Secondary | ICD-10-CM | POA: Diagnosis not present

## 2019-11-08 DIAGNOSIS — R109 Unspecified abdominal pain: Secondary | ICD-10-CM | POA: Diagnosis not present

## 2019-11-08 DIAGNOSIS — R309 Painful micturition, unspecified: Secondary | ICD-10-CM | POA: Diagnosis not present

## 2019-11-08 DIAGNOSIS — Z6820 Body mass index (BMI) 20.0-20.9, adult: Secondary | ICD-10-CM | POA: Diagnosis not present

## 2019-11-29 DIAGNOSIS — R011 Cardiac murmur, unspecified: Secondary | ICD-10-CM | POA: Diagnosis not present

## 2019-11-29 DIAGNOSIS — R0789 Other chest pain: Secondary | ICD-10-CM | POA: Diagnosis not present

## 2019-11-30 ENCOUNTER — Telehealth: Payer: Self-pay

## 2019-11-30 LAB — HEPATIC FUNCTION PANEL
ALT: 13 IU/L (ref 0–32)
AST: 15 IU/L (ref 0–40)
Albumin: 4.3 g/dL (ref 3.9–5.0)
Alkaline Phosphatase: 57 IU/L (ref 39–117)
Bilirubin Total: 0.4 mg/dL (ref 0.0–1.2)
Bilirubin, Direct: 0.11 mg/dL (ref 0.00–0.40)
Total Protein: 7 g/dL (ref 6.0–8.5)

## 2019-11-30 LAB — LIPID PANEL
Chol/HDL Ratio: 3 ratio (ref 0.0–4.4)
Cholesterol, Total: 133 mg/dL (ref 100–199)
HDL: 44 mg/dL (ref >39)
LDL Chol Calc (NIH): 77 mg/dL (ref 0–99)
Triglycerides: 56 mg/dL (ref 0–149)
VLDL Cholesterol Cal: 12 mg/dL (ref 5–40)

## 2019-11-30 NOTE — Telephone Encounter (Signed)
-----   Message from Garwin Brothers, MD sent at 11/30/2019  8:29 AM EST ----- The results of the study is unremarkable. Please inform patient. I will discuss in detail at next appointment. Cc  primary care/referring physician Garwin Brothers, MD 11/30/2019 8:29 AM

## 2019-11-30 NOTE — Telephone Encounter (Signed)
Patient is returning phone call.  °

## 2019-12-01 DIAGNOSIS — R5383 Other fatigue: Secondary | ICD-10-CM | POA: Diagnosis not present

## 2019-12-01 DIAGNOSIS — Z13220 Encounter for screening for lipoid disorders: Secondary | ICD-10-CM | POA: Diagnosis not present

## 2019-12-01 DIAGNOSIS — Z0000 Encounter for general adult medical examination without abnormal findings: Secondary | ICD-10-CM | POA: Diagnosis not present

## 2019-12-01 DIAGNOSIS — R350 Frequency of micturition: Secondary | ICD-10-CM | POA: Diagnosis not present

## 2019-12-01 DIAGNOSIS — Z131 Encounter for screening for diabetes mellitus: Secondary | ICD-10-CM | POA: Diagnosis not present

## 2019-12-01 DIAGNOSIS — Z03818 Encounter for observation for suspected exposure to other biological agents ruled out: Secondary | ICD-10-CM | POA: Diagnosis not present

## 2019-12-01 NOTE — Telephone Encounter (Signed)
Pt verbalized understanding of her Lipid and Liver results.

## 2019-12-01 NOTE — Telephone Encounter (Signed)
Patient returning call for results 

## 2019-12-15 DIAGNOSIS — R350 Frequency of micturition: Secondary | ICD-10-CM | POA: Diagnosis not present

## 2019-12-15 DIAGNOSIS — K219 Gastro-esophageal reflux disease without esophagitis: Secondary | ICD-10-CM | POA: Diagnosis not present

## 2019-12-15 DIAGNOSIS — G43909 Migraine, unspecified, not intractable, without status migrainosus: Secondary | ICD-10-CM | POA: Diagnosis not present

## 2019-12-15 DIAGNOSIS — F419 Anxiety disorder, unspecified: Secondary | ICD-10-CM | POA: Diagnosis not present

## 2019-12-26 ENCOUNTER — Ambulatory Visit: Payer: BC Managed Care – PPO | Admitting: Family

## 2020-01-06 DIAGNOSIS — Z3042 Encounter for surveillance of injectable contraceptive: Secondary | ICD-10-CM | POA: Diagnosis not present

## 2020-01-10 DIAGNOSIS — Z6821 Body mass index (BMI) 21.0-21.9, adult: Secondary | ICD-10-CM | POA: Diagnosis not present

## 2020-01-10 DIAGNOSIS — R635 Abnormal weight gain: Secondary | ICD-10-CM | POA: Diagnosis not present

## 2020-01-10 DIAGNOSIS — N63 Unspecified lump in unspecified breast: Secondary | ICD-10-CM | POA: Diagnosis not present

## 2020-01-11 ENCOUNTER — Other Ambulatory Visit: Payer: Self-pay | Admitting: Obstetrics and Gynecology

## 2020-01-11 DIAGNOSIS — N63 Unspecified lump in unspecified breast: Secondary | ICD-10-CM

## 2020-02-01 ENCOUNTER — Other Ambulatory Visit: Payer: Self-pay

## 2020-02-01 ENCOUNTER — Ambulatory Visit
Admission: RE | Admit: 2020-02-01 | Discharge: 2020-02-01 | Disposition: A | Discharge status: Home / Self Care | Payer: BC Managed Care – PPO | Source: Ambulatory Visit | Attending: Obstetrics and Gynecology | Admitting: Obstetrics and Gynecology

## 2020-02-01 DIAGNOSIS — N6489 Other specified disorders of breast: Secondary | ICD-10-CM | POA: Diagnosis not present

## 2020-02-01 DIAGNOSIS — N63 Unspecified lump in unspecified breast: Secondary | ICD-10-CM

## 2020-02-15 DIAGNOSIS — L700 Acne vulgaris: Secondary | ICD-10-CM | POA: Diagnosis not present

## 2020-03-16 DIAGNOSIS — N939 Abnormal uterine and vaginal bleeding, unspecified: Secondary | ICD-10-CM | POA: Diagnosis not present

## 2020-03-16 DIAGNOSIS — R300 Dysuria: Secondary | ICD-10-CM | POA: Diagnosis not present

## 2020-03-16 DIAGNOSIS — R110 Nausea: Secondary | ICD-10-CM | POA: Diagnosis not present

## 2020-03-16 DIAGNOSIS — R102 Pelvic and perineal pain: Secondary | ICD-10-CM | POA: Diagnosis not present

## 2020-03-16 DIAGNOSIS — Z793 Long term (current) use of hormonal contraceptives: Secondary | ICD-10-CM | POA: Diagnosis not present

## 2020-03-17 DIAGNOSIS — N939 Abnormal uterine and vaginal bleeding, unspecified: Secondary | ICD-10-CM | POA: Diagnosis not present

## 2020-03-17 DIAGNOSIS — Z793 Long term (current) use of hormonal contraceptives: Secondary | ICD-10-CM | POA: Diagnosis not present

## 2020-03-17 DIAGNOSIS — R1110 Vomiting, unspecified: Secondary | ICD-10-CM | POA: Diagnosis not present

## 2020-03-17 DIAGNOSIS — Z91010 Allergy to peanuts: Secondary | ICD-10-CM | POA: Diagnosis not present

## 2020-03-17 DIAGNOSIS — R102 Pelvic and perineal pain: Secondary | ICD-10-CM | POA: Diagnosis not present

## 2020-03-30 DIAGNOSIS — Z3042 Encounter for surveillance of injectable contraceptive: Secondary | ICD-10-CM | POA: Diagnosis not present

## 2020-05-07 DIAGNOSIS — R1031 Right lower quadrant pain: Secondary | ICD-10-CM | POA: Diagnosis not present

## 2020-05-07 DIAGNOSIS — Z91010 Allergy to peanuts: Secondary | ICD-10-CM | POA: Diagnosis not present

## 2020-05-07 DIAGNOSIS — R112 Nausea with vomiting, unspecified: Secondary | ICD-10-CM | POA: Diagnosis not present

## 2020-05-07 DIAGNOSIS — Z79890 Hormone replacement therapy (postmenopausal): Secondary | ICD-10-CM | POA: Diagnosis not present

## 2020-05-07 DIAGNOSIS — D259 Leiomyoma of uterus, unspecified: Secondary | ICD-10-CM | POA: Diagnosis not present

## 2020-05-08 DIAGNOSIS — D259 Leiomyoma of uterus, unspecified: Secondary | ICD-10-CM | POA: Diagnosis not present

## 2020-05-08 DIAGNOSIS — R1031 Right lower quadrant pain: Secondary | ICD-10-CM | POA: Diagnosis not present

## 2020-05-25 DIAGNOSIS — Z113 Encounter for screening for infections with a predominantly sexual mode of transmission: Secondary | ICD-10-CM | POA: Diagnosis not present

## 2020-05-25 DIAGNOSIS — Z124 Encounter for screening for malignant neoplasm of cervix: Secondary | ICD-10-CM | POA: Diagnosis not present

## 2020-05-25 DIAGNOSIS — Z6822 Body mass index (BMI) 22.0-22.9, adult: Secondary | ICD-10-CM | POA: Diagnosis not present

## 2020-05-25 DIAGNOSIS — R350 Frequency of micturition: Secondary | ICD-10-CM | POA: Diagnosis not present

## 2020-05-25 DIAGNOSIS — Z01419 Encounter for gynecological examination (general) (routine) without abnormal findings: Secondary | ICD-10-CM | POA: Diagnosis not present

## 2020-05-30 IMAGING — US US BREAST*L* LIMITED INC AXILLA
1 series · 2 of 2 positions shown · non-contrast
Comparison: None

CLINICAL DATA: The patient's mother was diagnosed with breast
cancer at the age of 38. The patient presents with a palpable lump
in the left breast at 9 o'clock.

EXAM:
ULTRASOUND OF THE LEFT BREAST

[Series 1: us breast*left* limited inc axilla · 0.06mm/px · 2 of 2 slices shown]
[im 1/2]
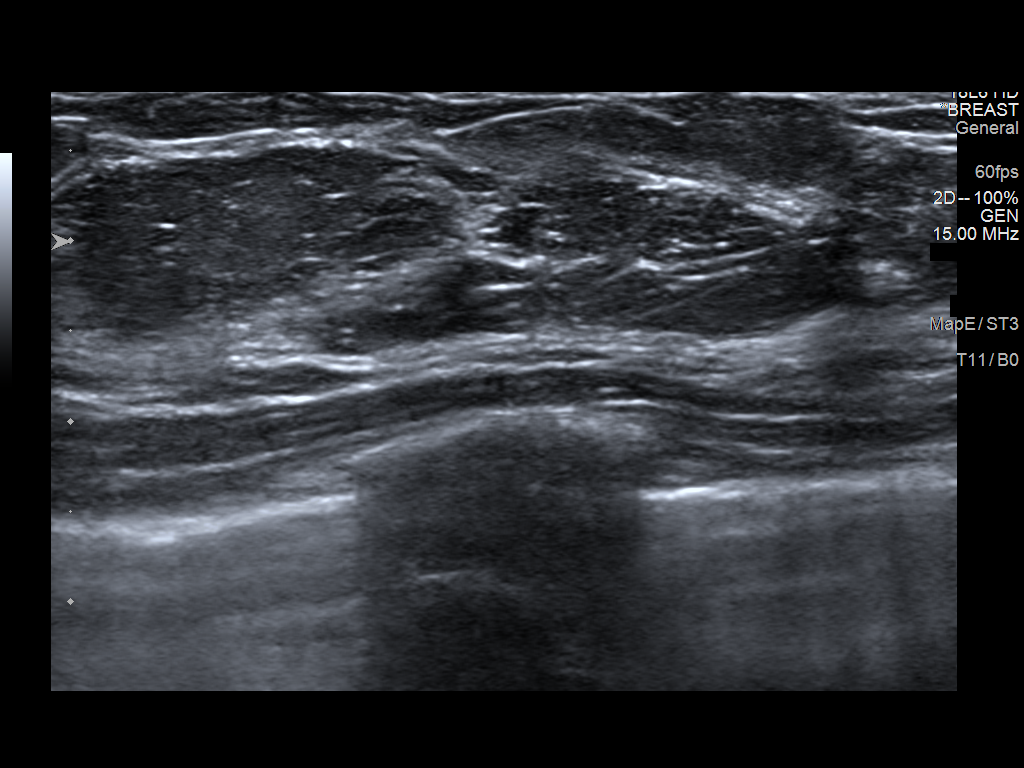
[im 2/2]
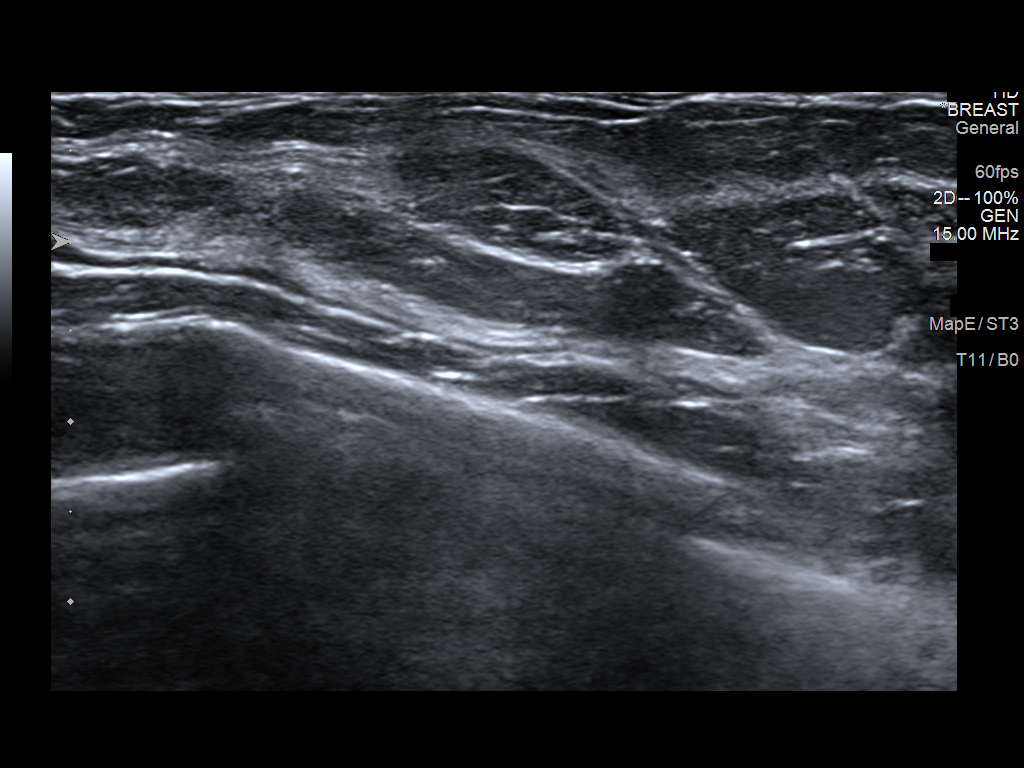

[2 of 2 positions shown; findings below may reference images not displayed]

FINDINGS: On physical exam, no suspicious lumps are identified.

Targeted ultrasound is performed, showing no sonographic
abnormalities.
IMPRESSION: No sonographic evidence of malignancy.

RECOMMENDATION:
By report, the patient's mother was genetically negative and the
patient has a lifetime risk of breast cancer of 20.2%. The patient
should begin screening mammography any time between the ages of 29
in 30. When screening mammography begins, recommend screening breast
MRI as well. Treatment of the patient's symptoms should be based on
clinical and physical exam given lack of imaging findings.

I have discussed the findings and recommendations with the patient.
If applicable, a reminder letter will be sent to the patient
regarding the next appointment.

BI-RADS CATEGORY  1: Negative.

## 2020-06-07 DIAGNOSIS — R110 Nausea: Secondary | ICD-10-CM | POA: Diagnosis not present

## 2020-06-07 DIAGNOSIS — Z20822 Contact with and (suspected) exposure to covid-19: Secondary | ICD-10-CM | POA: Diagnosis not present

## 2020-06-07 DIAGNOSIS — R05 Cough: Secondary | ICD-10-CM | POA: Diagnosis not present

## 2020-06-07 DIAGNOSIS — R519 Headache, unspecified: Secondary | ICD-10-CM | POA: Diagnosis not present

## 2020-06-10 DIAGNOSIS — Z91010 Allergy to peanuts: Secondary | ICD-10-CM | POA: Diagnosis not present

## 2020-06-10 DIAGNOSIS — R079 Chest pain, unspecified: Secondary | ICD-10-CM | POA: Diagnosis not present

## 2020-06-10 DIAGNOSIS — Z20822 Contact with and (suspected) exposure to covid-19: Secondary | ICD-10-CM | POA: Diagnosis not present

## 2020-06-10 DIAGNOSIS — Z79899 Other long term (current) drug therapy: Secondary | ICD-10-CM | POA: Diagnosis not present

## 2020-06-10 DIAGNOSIS — R438 Other disturbances of smell and taste: Secondary | ICD-10-CM | POA: Diagnosis not present

## 2020-06-10 DIAGNOSIS — R0602 Shortness of breath: Secondary | ICD-10-CM | POA: Diagnosis not present

## 2020-06-10 DIAGNOSIS — J069 Acute upper respiratory infection, unspecified: Secondary | ICD-10-CM | POA: Diagnosis not present

## 2020-06-10 DIAGNOSIS — R509 Fever, unspecified: Secondary | ICD-10-CM | POA: Diagnosis not present

## 2020-06-11 DIAGNOSIS — R0602 Shortness of breath: Secondary | ICD-10-CM | POA: Diagnosis not present

## 2020-06-22 DIAGNOSIS — Z3042 Encounter for surveillance of injectable contraceptive: Secondary | ICD-10-CM | POA: Diagnosis not present

## 2020-09-18 DIAGNOSIS — Z3042 Encounter for surveillance of injectable contraceptive: Secondary | ICD-10-CM | POA: Diagnosis not present

## 2020-10-13 DIAGNOSIS — R112 Nausea with vomiting, unspecified: Secondary | ICD-10-CM | POA: Diagnosis not present

## 2020-10-13 DIAGNOSIS — Z91010 Allergy to peanuts: Secondary | ICD-10-CM | POA: Diagnosis not present

## 2020-10-13 DIAGNOSIS — L309 Dermatitis, unspecified: Secondary | ICD-10-CM | POA: Diagnosis not present

## 2020-10-13 DIAGNOSIS — R21 Rash and other nonspecific skin eruption: Secondary | ICD-10-CM | POA: Diagnosis not present

## 2020-10-13 DIAGNOSIS — R519 Headache, unspecified: Secondary | ICD-10-CM | POA: Diagnosis not present

## 2020-10-13 DIAGNOSIS — Z79899 Other long term (current) drug therapy: Secondary | ICD-10-CM | POA: Diagnosis not present

## 2020-10-13 DIAGNOSIS — L299 Pruritus, unspecified: Secondary | ICD-10-CM | POA: Diagnosis not present

## 2020-11-04 ENCOUNTER — Encounter (HOSPITAL_BASED_OUTPATIENT_CLINIC_OR_DEPARTMENT_OTHER): Payer: Self-pay

## 2020-11-04 ENCOUNTER — Emergency Department (HOSPITAL_BASED_OUTPATIENT_CLINIC_OR_DEPARTMENT_OTHER): Payer: BC Managed Care – PPO

## 2020-11-04 ENCOUNTER — Emergency Department (HOSPITAL_BASED_OUTPATIENT_CLINIC_OR_DEPARTMENT_OTHER)
Admission: EM | Admit: 2020-11-04 | Discharge: 2020-11-04 | Disposition: A | Discharge status: Home / Self Care | Payer: BC Managed Care – PPO | Attending: Emergency Medicine | Admitting: Emergency Medicine

## 2020-11-04 ENCOUNTER — Other Ambulatory Visit: Payer: Self-pay

## 2020-11-04 DIAGNOSIS — R519 Headache, unspecified: Secondary | ICD-10-CM | POA: Insufficient documentation

## 2020-11-04 DIAGNOSIS — S8991XA Unspecified injury of right lower leg, initial encounter: Secondary | ICD-10-CM | POA: Diagnosis present

## 2020-11-04 DIAGNOSIS — R1110 Vomiting, unspecified: Secondary | ICD-10-CM | POA: Insufficient documentation

## 2020-11-04 DIAGNOSIS — S86911A Strain of unspecified muscle(s) and tendon(s) at lower leg level, right leg, initial encounter: Secondary | ICD-10-CM | POA: Diagnosis not present

## 2020-11-04 DIAGNOSIS — Z91010 Allergy to peanuts: Secondary | ICD-10-CM | POA: Insufficient documentation

## 2020-11-04 DIAGNOSIS — Y92410 Unspecified street and highway as the place of occurrence of the external cause: Secondary | ICD-10-CM | POA: Insufficient documentation

## 2020-11-04 MED ORDER — CYCLOBENZAPRINE HCL 10 MG PO TABS: 10.0000 mg | ORAL_TABLET | Freq: Two times a day (BID) | ORAL | 0 refills | Status: AC | PRN

## 2020-11-04 MED ORDER — OXYCODONE-ACETAMINOPHEN 5-325 MG PO TABS
1.0000 | ORAL_TABLET | Freq: Once | ORAL | Status: AC
  Administered 2020-11-04: 1 via ORAL
  Filled 2020-11-04: qty 1

## 2020-11-04 MED ORDER — ONDANSETRON 4 MG PO TBDP
4.0000 mg | ORAL_TABLET | Freq: Once | ORAL | Status: AC
  Administered 2020-11-04: 4 mg via ORAL
  Filled 2020-11-04: qty 1

## 2020-11-04 NOTE — ED Notes (Signed)
She returns from x-ray at this time.
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
## 2020-11-04 NOTE — Discharge Instructions (Signed)
If you develop new or worsening symptoms, return to ER for reassessment.  Take Tylenol and Motrin as needed for pain control.  Can take prescribed muscle relaxer as needed as well.
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
